# Patient Record
Sex: Female | Born: 1970 | Race: White | Hispanic: No | Marital: Single | Smoking: Never smoker
Health system: Southern US, Community
[De-identification: ages and names within clinical notes are randomized; demographics above are authoritative.]

## PROBLEM LIST (undated history)

## (undated) DIAGNOSIS — N809 Endometriosis, unspecified: Secondary | ICD-10-CM

## (undated) DIAGNOSIS — G43909 Migraine, unspecified, not intractable, without status migrainosus: Secondary | ICD-10-CM

## (undated) DIAGNOSIS — R251 Tremor, unspecified: Secondary | ICD-10-CM

## (undated) HISTORY — DX: Tremor, unspecified: R25.1

## (undated) HISTORY — DX: Migraine, unspecified, not intractable, without status migrainosus: G43.909

## (undated) HISTORY — DX: Endometriosis, unspecified: N80.9

## (undated) HISTORY — PX: ABDOMINAL HYSTERECTOMY: SHX81

## (undated) HISTORY — PX: APPENDECTOMY: SHX54

## (undated) HISTORY — PX: CHOLECYSTECTOMY: SHX55

---

## 2000-11-27 ENCOUNTER — Other Ambulatory Visit: Admission: RE | Admit: 2000-11-27 | Discharge: 2000-11-27 | Payer: Self-pay | Admitting: Obstetrics and Gynecology

## 2001-11-09 ENCOUNTER — Inpatient Hospital Stay (HOSPITAL_COMMUNITY): Admission: RE | Admit: 2001-11-09 | Discharge: 2001-11-12 | Payer: Self-pay | Admitting: Obstetrics and Gynecology

## 2002-04-22 ENCOUNTER — Other Ambulatory Visit: Admission: RE | Admit: 2002-04-22 | Discharge: 2002-04-22 | Payer: Self-pay | Admitting: Obstetrics and Gynecology

## 2003-05-05 ENCOUNTER — Other Ambulatory Visit: Admission: RE | Admit: 2003-05-05 | Discharge: 2003-05-05 | Payer: Self-pay | Admitting: Obstetrics and Gynecology

## 2003-12-25 ENCOUNTER — Ambulatory Visit (HOSPITAL_COMMUNITY): Admission: RE | Admit: 2003-12-25 | Discharge: 2003-12-25 | Payer: Self-pay | Admitting: Obstetrics and Gynecology

## 2004-06-03 ENCOUNTER — Encounter (INDEPENDENT_AMBULATORY_CARE_PROVIDER_SITE_OTHER): Payer: Self-pay | Admitting: Specialist

## 2004-06-03 ENCOUNTER — Inpatient Hospital Stay (HOSPITAL_COMMUNITY): Admission: RE | Admit: 2004-06-03 | Discharge: 2004-06-07 | Payer: Self-pay | Admitting: Obstetrics and Gynecology

## 2004-10-04 ENCOUNTER — Other Ambulatory Visit: Admission: RE | Admit: 2004-10-04 | Discharge: 2004-10-04 | Payer: Self-pay | Admitting: Obstetrics and Gynecology

## 2005-03-31 ENCOUNTER — Ambulatory Visit (HOSPITAL_COMMUNITY): Admission: RE | Admit: 2005-03-31 | Discharge: 2005-03-31 | Payer: Self-pay | Admitting: Obstetrics and Gynecology

## 2005-04-05 ENCOUNTER — Ambulatory Visit (HOSPITAL_COMMUNITY): Admission: RE | Admit: 2005-04-05 | Discharge: 2005-04-05 | Payer: Self-pay | Admitting: Obstetrics and Gynecology

## 2005-10-19 ENCOUNTER — Other Ambulatory Visit: Admission: RE | Admit: 2005-10-19 | Discharge: 2005-10-19 | Payer: Self-pay | Admitting: Obstetrics and Gynecology

## 2006-04-16 ENCOUNTER — Observation Stay (HOSPITAL_COMMUNITY): Admission: EM | Admit: 2006-04-16 | Discharge: 2006-04-18 | Payer: Self-pay | Admitting: *Deleted

## 2006-06-05 ENCOUNTER — Ambulatory Visit (HOSPITAL_COMMUNITY): Admission: RE | Admit: 2006-06-05 | Discharge: 2006-06-05 | Payer: Self-pay | Admitting: Internal Medicine

## 2006-07-02 ENCOUNTER — Ambulatory Visit (HOSPITAL_COMMUNITY): Admission: RE | Admit: 2006-07-02 | Discharge: 2006-07-02 | Payer: Self-pay | Admitting: Gastroenterology

## 2006-07-23 ENCOUNTER — Ambulatory Visit: Payer: Self-pay | Admitting: Cardiology

## 2006-08-02 ENCOUNTER — Ambulatory Visit: Payer: Self-pay | Admitting: Cardiology

## 2007-03-15 ENCOUNTER — Ambulatory Visit (HOSPITAL_BASED_OUTPATIENT_CLINIC_OR_DEPARTMENT_OTHER): Admission: RE | Admit: 2007-03-15 | Discharge: 2007-03-15 | Payer: Self-pay | Admitting: Urology

## 2007-10-04 ENCOUNTER — Encounter: Admission: RE | Admit: 2007-10-04 | Discharge: 2007-10-04 | Payer: Self-pay | Admitting: Obstetrics and Gynecology

## 2008-03-31 ENCOUNTER — Encounter: Admission: RE | Admit: 2008-03-31 | Discharge: 2008-03-31 | Payer: Self-pay | Admitting: Obstetrics and Gynecology

## 2008-08-07 ENCOUNTER — Ambulatory Visit (HOSPITAL_COMMUNITY): Admission: RE | Admit: 2008-08-07 | Discharge: 2008-08-07 | Payer: Self-pay | Admitting: Family Medicine

## 2008-08-31 ENCOUNTER — Encounter (INDEPENDENT_AMBULATORY_CARE_PROVIDER_SITE_OTHER): Payer: Self-pay | Admitting: Surgery

## 2008-08-31 ENCOUNTER — Ambulatory Visit (HOSPITAL_COMMUNITY): Admission: RE | Admit: 2008-08-31 | Discharge: 2008-08-31 | Payer: Self-pay | Admitting: Surgery

## 2009-02-05 ENCOUNTER — Encounter: Admission: RE | Admit: 2009-02-05 | Discharge: 2009-02-05 | Payer: Self-pay | Admitting: Obstetrics and Gynecology

## 2010-03-01 ENCOUNTER — Encounter: Admission: RE | Admit: 2010-03-01 | Discharge: 2010-03-01 | Payer: Self-pay | Admitting: Obstetrics and Gynecology

## 2010-10-06 LAB — CBC
HCT: 39.7 % (ref 36.0–46.0)
Hemoglobin: 13.6 g/dL (ref 12.0–15.0)
MCHC: 34.2 g/dL (ref 30.0–36.0)
MCV: 92.8 fL (ref 78.0–100.0)
Platelets: 281 10*3/uL (ref 150–400)
RBC: 4.28 MIL/uL (ref 3.87–5.11)
RDW: 12.4 % (ref 11.5–15.5)
WBC: 4.4 10*3/uL (ref 4.0–10.5)

## 2010-10-06 LAB — DIFFERENTIAL
Basophils Absolute: 0 10*3/uL (ref 0.0–0.1)
Basophils Relative: 1 % (ref 0–1)
Eosinophils Absolute: 0.1 10*3/uL (ref 0.0–0.7)
Eosinophils Relative: 3 % (ref 0–5)
Lymphocytes Relative: 30 % (ref 12–46)
Lymphs Abs: 1.3 10*3/uL (ref 0.7–4.0)
Monocytes Absolute: 0.3 10*3/uL (ref 0.1–1.0)
Monocytes Relative: 7 % (ref 3–12)
Neutro Abs: 2.6 10*3/uL (ref 1.7–7.7)
Neutrophils Relative %: 59 % (ref 43–77)

## 2010-10-06 LAB — COMPREHENSIVE METABOLIC PANEL
ALT: 10 U/L (ref 0–35)
AST: 13 U/L (ref 0–37)
Albumin: 4.6 g/dL (ref 3.5–5.2)
Alkaline Phosphatase: 55 U/L (ref 39–117)
BUN: 5 mg/dL — ABNORMAL LOW (ref 6–23)
CO2: 25 mEq/L (ref 19–32)
Calcium: 8.9 mg/dL (ref 8.4–10.5)
Chloride: 106 mEq/L (ref 96–112)
Creatinine, Ser: 0.68 mg/dL (ref 0.4–1.2)
GFR calc Af Amer: >60 mL/min (ref >60)
GFR calc non Af Amer: >60 mL/min (ref >60)
Glucose, Bld: 89 mg/dL (ref 70–99)
Potassium: 3.9 mEq/L (ref 3.5–5.1)
Sodium: 136 mEq/L (ref 135–145)
Total Bilirubin: 0.9 mg/dL (ref 0.3–1.2)
Total Protein: 6.9 g/dL (ref 6.0–8.3)

## 2010-11-08 NOTE — Op Note (Signed)
NAME:  Kelly Fields, Kelly Fields NO.:  [id number]   MEDICAL RECORD NO.:  [id number]          PATIENT TYPE:  AMB   LOCATION:  N[facility]                         FACILITY:  [facility]   PHYSICIAN:  Duane Boston, MD       DATE OF BIRTH:  1971/03/09   DATE OF PROCEDURE:  03/15/2007  DATE OF DISCHARGE:                               OPERATIVE REPORT   PREOPERATIVE DIAGNOSIS:  Right ureteral stone.   POSTOPERATIVE DIAGNOSIS:  Right ureteral stone.   PROCEDURE PERFORMED:  Cystoscopy, right retrograde pyelography, right  ureteroscopy, right ureteral stent placement.   SURGEON:  Jethro Bolus, MD.   ASSISTANTTerrial Rhodes. Richard, MD.   ANESTHESIA:  General endotracheal.   INDICATIONS FOR PROCEDURE:  Ms. Magnus Ivan is a 40 year old female with a  history of a ureteral stone.  This has failed conservative passage.  She  desires ureteroscopy.   DESCRIPTION OF PROCEDURE IN DETAIL:  The patient was brought to the  operating room.  She was identified by her armband. Informed consent was  verified, and a preoperative timeout was performed.  After the  successful induction of general anesthesia, the patient was moved to the  dorsal lithotomy position where all appropriate pressure points were  padded to avoid neurapraxic compartment syndrome.  The perineum was  prepped and draped, the surgeon was gowned and gloved.  Perioperative  antibiotics were administered.  A #22-French cystoscopic sheath was used  to introduce the 12-degree cystoscopic lens and the transurethral  balloon into the bladder.  Pan-cystourethroscopy was performed.  Bilateral ureteral orifices were noted to be in normal anatomic  position.  The right orifice was cannulated with a #5-French __________  catheter.  Contrast was injected, and a right retrograde pyelogram was  performed.   Right retrograde pyelogram revealed a small-caliber ureter with no  discrete narrowing.  There was an opacification in the proximal ureter  consistent with a stone.  There was no hydronephrosis.  A Glidewire was  advanced into the renal pelvis under fluoroscopic control and then all  the catheters were removed.  A 6.5 long semi-rigid ureteroscope was  driven into the ureter under direct vision.  Even just cannulating the  orifice was technically difficult.  The ureteroscope was gradually  advanced to the mid ureter with much difficulty secondary to the  narrowed caliber of the ureter.  This was likely simply a normal variant  in anatomy.  The ureteroscope did not reach the level of the stone.  A  second Glidewire was placed.  The patient was dropped into steep  Trendelenburg.  Attempts at passing a flexible scope over a wire were  unsuccessful.  Attempts at dilating the introducer of the access sheath  was unsuccessful.  At this time to avoid any damage or trauma to the  ureter, the decision was made to trace the stent and allow the ureter to  passively dilate.  A 6 x 24 double-J ureteral stent was advanced under  the renal pelvis under fluoroscopic control.  Once seen there, the wire  was removed.  Excellent proximal, distal curls were noted.  The bladder  was drained.  The procedure was terminated.  The patient tolerated the  procedure well, and there were no complications.  Sigmund Patsi Sears was  the attending primary responsible physician and was present and  participated in all aspects of the procedure.   DISPOSITION:  The patient was extubated uneventfully and transported  safely to the postanesthesia care unit.  She will be given a  prescription for Vicodin 5/500, #30.  She will likely need a second  procedure for definitive stone management, but her stent will stay in  place to passively dilate for at least one week.      Duane Boston, MD     BP/MEDQ  D:  03/15/2007  T:  03/15/2007  Job:  [id number]

## 2010-11-08 NOTE — Op Note (Signed)
NAME:  Kelly Fields, ASA NO.:  [id number]   MEDICAL RECORD NO.:  [id number]          PATIENT TYPE:  AMB   LOCATION:  DAY                          FACILITY:  [facility]   PHYSICIAN:  Sandria Bales. Ezzard Standing, M.D.  DATE OF BIRTH:  [birth year]   DATE OF PROCEDURE:  08/31/2008  DATE OF DISCHARGE:                               OPERATIVE REPORT   Date of surgery - 31 August 2008   PREOPERATIVE DIAGNOSIS:  Biliary dyskinesia.   POSTOPERATIVE DIAGNOSIS:  Biliary dyskinesia.   PROCEDURES:  Laparoscopic cholecystectomy with intraoperative  cholangiogram.   SURGEON:  Dr. Ovidio Kin.   FIRST ASSISTANT:  Velora Heckler, MD   ANESTHESIA:  General endotracheal.   ESTIMATED BLOOD LOSS:  Minimal.   INDICATIONS FOR PROCEDURE:  Kelly Fields is a 40 year old  white female, a patient of Dr. Mosie Epstein, who has had some vague  abdominal and epigastric pain.  She actually went through a cardiac  evaluation about 3 years ago. She had a ultrasound which showed no  gallstones; however, she did have a hepatobiliary scan dated August 07, 2008 that showed a 12% ejection fraction of the gallbladder.  This  is consistent with biliary dyskinesia.   I discussed with her the indications, potential complications of  gallbladder surgery.  Potential complications include, but are not  limited to, bleeding, infection, bile duct injury and open surgery.   I also spoke to her that with biliary dyskinesia the surgery seems to  help about 70% of patients but may not help as many as 30% of patients.   OPERATIVE NOTE:  The patient was placed in supine position, given a  general endotracheal anesthetic.  Her abdomen was prepped with Techni-  Care substitute and then sterilely draped.  A time-out was held  identifying the patient and the procedure.   The abdomen was entered through infraumbilical incision.  I used a 10 mm  laparoscope through a 12 mm Hasson trocar.  Hasson trocar secured with  a  0 Vicryl suture. I did abdominal exploration, revealed right and left  lobes of liver unremarkable.  The stomach is unremarkable bowel that I  could see was unremarkable.  I then turned the attention to the  gallbladder.  I placed a 10-mm subxiphoid trocar, a 5 mm right mid  subcostal, and a 5 mm lateral subcostal trocar.  The gallbladder was  rotated cephalad.  Dissection was carried out identifying the  gallbladder cystic duct junction and I did shoot an intraoperative  cholangiogram.   Intraoperative cholangiogram was shot using cutoff taut catheter  inserted through a 14 gauge Angiocath and then the taut catheter was  inserted in the side of the cut cystic duct.  This was secured with an  Endo clip.  I injected about 10 mL of half-strength Hypaque solution  under fluoroscopy.  This showed free flow down the cystic duct into the  common bile duct, up the hepatic radicals and down into the duodenum.  This showed it to be a normal intraoperative cholangiogram.   The taut catheter was then removed.  The cystic duct triply endoclipped  and divided.  The cystic artery was doubly endoclipped and divided and  there was also a posterior branch of cystic artery identified.   The gallbladder was sharply and bluntly dissected from the gallbladder  bed.  Prior to complete division of the gallbladder from the gallbladder  bed.  I revisualized the triangle of Calot and revisualized the  gallbladder bed.  This is no bleeding or bile leak from either.   The gallbladder was then placed in EndoCatch bag and delivered through  the umbilicus.  The abdomen was irrigated with about 500 mL of saline  and delivered through the umbilicus.   The umbilical port was closed with 0 Vicryl suture.  The skin at each  port was closed with 5-0 Vicryl suture, painted with Dermabond and  sterilely dressed.  The plan is to let the patient to home today if she  wakes up well.  She will be seen back in about 2 to 3  weeks for follow-  up.      Sandria Bales. Ezzard Standing, M.D.  Electronically Signed     DHN/MEDQ  D:  08/31/2008  T:  09/01/2008  Job:  [id number]   cc:   Dineen Kid. Rana Snare, M.D.  Fax: [phone]   Elvina Sidle, M.D.  Fax: [phone]   Evie Lacks, MD  Fax: [phone]

## 2010-11-11 NOTE — H&P (Signed)
NAME:  Kelly Fields, Kelly Fields NO.:  [id number]   MEDICAL RECORD NO.:  [id number]          PATIENT TYPE:  AMB   LOCATION:  [facility]C                           FACILITY:  [facility]   PHYSICIAN:  James A. Ashley Royalty, M.D.DATE OF BIRTH:  1971-03-26   DATE OF ADMISSION:  04/05/2005  DATE OF DISCHARGE:                                HISTORY & PHYSICAL   HISTORY OF PRESENT ILLNESS:  This is a 40 year old nulligravida with a long  history of endometriosis who presented March 29, 2005, complaining of lower  abdominal discomfort - debilitating.  She is status post TAH/LSO, lysis of  adhesions, and appendectomy December 2005.  Interestingly, at the time of  that procedure, there was no right ovary demonstrable to me, my surgical  assistant, or to the general surgeon, Dr. Orson Slick.  The patient presented  March 29, 2005, complaining of lower abdominal discomfort, right greater  than left.  Onset was March 24, 2005.  She described it as intermittent  in nature.  There was no concomitant nausea, vomiting, fever, or other GI  symptoms.  An FSH was obtained and the value was 5.5.  A CT scan was  obtained as well.  The CT scan revealed a thick-walled fluid collection in  the area of the right adnexa measuring 4 x 3.4 cm.  It was felt to be a  right ovarian cyst if the right ovary remains.  The patient was initially  managed with Lorcet for pain pending the outcome of the CT scan.  She states  the pain has improved slightly since the CT scan but she still has  difficulty getting around and doing her daily activities despite the use of  the narcotic analgesia.  In short, she requests surgical intervention.   MEDICATIONS:  None.   PAST MEDICAL HISTORY:  Medical:  Negative.  Surgical:  As above.   ALLERGIES:  None.   FAMILY HISTORY:  Positive for hypertension and diabetes.   SOCIAL HISTORY:  The patient denies the use of tobacco or significant  alcohol.   REVIEW OF SYSTEMS:   Noncontributory.   PHYSICAL EXAMINATION:  GENERAL:  A well-developed, well-nourished white  female in no acute distress.  VITAL SIGNS:  Afebrile, vital signs stable.  CHEST:  Lungs were clear.  CARDIAC:  Regular rate and rhythm.  ABDOMEN:  Soft, nontender, without masses or organomegaly.  There is a well-  healed surgical scar.  PELVIC:  External genitalia within normal limits.  Vagina without lesions.  Vaginal cuff is well-healed.  Bimanual examination reveals surgical absence  of the uterus.  I can appreciate no obvious adnexal masses.   IMPRESSION:  1.  History of endometriosis.  2.  Status post total abdominal hysterectomy, left salpingo-oophorectomy,      adhesiolysis, and appendectomy in December 2005.  No demonstrable right      adnexa was present at the time of that procedure.  3.  Recent CT scan revealing right ovarian cyst of 4 cm in greatest      diameter.  4.  Lower abdominal/pelvic discomfort, right greater than left.      Differential includes adnexal  cyst, musculoskeletal, adhesions, primary      GI, etc.  Debilitating.   PLAN:  Diagnostic/operative laparoscopy with possible right salpingo-  oophorectomy and indicated procedures.  Risks, benefits, complications, and  alternatives were fully discussed with the patient.  Possibility of  exploratory laparotomy discussed and accepted.  Questions invited and  answered.           ______________________________  Rudy Jew Ashley Royalty, M.D.     JAM/MEDQ  D:  04/05/2005  T:  04/05/2005  Job:  [id number]

## 2010-11-11 NOTE — Cardiovascular Report (Signed)
NAME:  Kelly Fields, Kelly Fields NO.:  [id number]   MEDICAL RECORD NO.:  [id number]          PATIENT TYPE:  INP   LOCATION:  3731                         FACILITY:  [facility]   PHYSICIAN:  Darlin Priestly, MD  DATE OF BIRTH:  February 16, 1971   DATE OF PROCEDURE:  DATE OF DISCHARGE:                              CARDIAC CATHETERIZATION   PROCEDURES:  1. Right heart catheterization.  2. Coronary angiography.  3. Left ventriculogram.   ATTENDING SURGEON:  Dr. Lenise Herald   COMPLICATIONS:  None.   INDICATION:  [name] is a 40 year old female patient of Dr. Yates Decamp  and [facility] with several months of intermittent chest pain.  She  was admitted on April 17, 2006 and subsequently ruled out for MI with  negative CK-MB and troponins.  She did have a cardiac CT which was  reportedly negative for pulmonary embolus.  Because of her intermittent  symptoms, she is now referred for cardiac catheterization to rule out  significant CAD.   DESCRIPTION OF OPERATION:  After obtaining informed written consent, the  patient was brought to the cardiac cath lab where the right groin was  shaved, prepped and draped in the usual sterile fashion.  Anesthesia monitor  established.  With the modified Seldinger technique, a #6-French  intraarterial sheath was inserted in the right femoral artery.  A 6-French  diagnostic catheter was used to perform diagnostic angiography.   The left main is a large vessel with no evidence of disease.   The LAD is a medium-to-large-sized vessel which courses to give rise to 2  diagonal branches.  The LAD has no evidence of disease.   The first diagonal is a small-to-medium-sized vessel with no evidence of  disease,.   The second diagonal is a small vessel with no evidence of disease.   The left circumflex is a medium-sized vessel which courses to give rise to 1  obtuse marginal branch.  There is a kinking segment in the proximal portion  of  the left circumflex, though this does not appear to be obstructing flow.  There is a TIMI-3 flow throughout the AV groove circumflex as well as the  first OM.   The first OM is a medium-sized vessel which bifurcates into the mid-segment  with no evidence of disease.   The right coronary artery is a large vessel which is dominant and gives rise  to the PDA, as well as posterolateral branch.  There is also noted to be a  notable branch arising from the PLA.  The RCA, PDA and posterolateral branch  have no significant disease.   The left ventriculogram reveals a preserved EF at 60%.   Hemodynamics:  Systemic arterial pressure 100/65, LV systemic pressure 90/4,  LVEDP of 10.   CONCLUSION:  1. Noncritical CAD.  2. Normal LV systolic function.      Darlin Priestly, MD  Electronically Signed     RHM/MEDQ  D:  04/17/2006  T:  04/17/2006  Job:  [id number]   cc:   Cristy Hilts. Jacinto Halim, MD  [facility]

## 2010-11-11 NOTE — H&P (Signed)
NAME:  Kelly Fields, Kelly Fields NO.:  [id number]   MEDICAL RECORD NO.:  [id number]                  PATIENT TYPE:   LOCATION:                                       FACILITY:   PHYSICIAN:  Rudy Jew. Ashley Royalty, M.D.             DATE OF BIRTH:  1971-05-18   DATE OF ADMISSION:  12/25/2003  DATE OF DISCHARGE:                                HISTORY & PHYSICAL   HISTORY OF PRESENT ILLNESS:  This is a 40 year old nulligravida with a known  history of endometriosis who presented to me recently complaining of  increasing right lower quadrant discomfort which is debilitating.  She seeks  surgical intervention for same.  She is status post discharge laparoscopy  with exploratory laparotomy, right salpingectomy, lysis of adhesions on Nov 08, 2001.  Multiple biopsies were taken as well at that time which returned  showing endometriosis.  Multiple lesions were fulgurated at that time.  The  patient has been maintained intermittently on Lupron since then with  reasonable control until the recent flare up.  The patient specifically  states to me today she has no desire for childbearing ever and ultimately  may want to have a hysterectomy with bilateral adnexectomy.  Ultrasound was  obtained December 10, 2003 which revealed basically no obvious abnormalities  save for a 4 mm uterine fibroid.   PAST MEDICAL HISTORY:  Medical:  Negative.  Surgical:  As above.   ALLERGIES:  None.   FAMILY HISTORY:  Positive for hypertension and diabetes.   SOCIAL HISTORY:  The patient denies the use of tobacco or significant  alcohol.   REVIEW OF SYSTEMS:  Noncontributory.   PHYSICAL EXAMINATION:  GENERAL:  Well-developed, well-nourished, pleasant  white female in no acute distress.  VITAL SIGNS:  Afebrile, vital signs stable.  SKIN:  Warm and dry without lesions.  LYMPH:  There is no supraclavicular, cervical, or inguinal adenopathy.  HEENT:  Normocephalic.  NECK:  Supple without  thyromegaly.  CHEST:  Lungs are clear.  CARDIAC:  Regular rate and rhythm without murmurs, gallops, or rubs.  BREAST:  Deferred.  ABDOMEN:  Soft, nontender, without masses or organomegaly.  Bowel sounds are  active.  MUSCULOSKELETAL:  Reveals full range of motion without edema, cyanosis, or  CVA tenderness.  PELVIC:  (June 1) External genitalia within normal limits.  Vagina and  cervix were without gross lesions.  Bimanual examination reveals the uterus  to be approximately 8 x 4 x 4 cm and no adnexal masses are palpable.   IMPRESSION:  1. Endometriosis.  2. Right lower quadrant discomfort - differential includes endometriosis,     adnexal pathology, primary gastrointestinal, etcetera.   PLAN:  Diagnostic/operative laparoscopy.  Risks, benefits, complications,  and alternatives fully discussed with the patient.  The possibility of  unilateral salpingo-oophorectomy discussed and accepted.  The possibility of  exploratory laparotomy discussed and accepted.  Questions invited and  answered.  James A. Ashley Royalty, M.D.    JAM/MEDQ  D:  12/25/2003  T:  12/25/2003  Job:  [id number]   cc:   Outpatient surgery at [facility]

## 2010-11-11 NOTE — Op Note (Signed)
NAME:  Kelly Fields, Kelly Fields NO.:  [id number]   MEDICAL RECORD NO.:  [id number]          PATIENT TYPE:  INP   LOCATION:  9318                          FACILITY:  [facility]   PHYSICIAN:  Lorre Munroe., M.D.DATE OF BIRTH:  1970-11-27   DATE OF PROCEDURE:  06/03/2004  DATE OF DISCHARGE:                                 OPERATIVE REPORT   PREOPERATIVE DIAGNOSIS:  Difficult hysterectomy.   POSTOPERATIVE DIAGNOSIS:  Difficult hysterectomy.   REASON FOR CONSULTATION:  Dr. Ashley Royalty called me because there was chronic  inflammation and difficulty in visualization of the ovaries, considerable  involvement of the intestines and pelvic adhesions while he was performing  hysterectomy and bilateral salpingo-oophorectomy.  He asked me to come in to  assist with definition of the anatomy and handling of bowel.  The patient is  a 40 year old white female known to have endometriosis.  She is not known to  have any chronic gastrointestinal symptoms.  She did have some chronic pain.   PROCEDURE IN DETAIL:  I came and scrubbed in and Dr. Ashley Royalty demonstrated  the anatomy to me.  He had been able to free up the uterus from the bowel  and he and Dr. Clearance Coots continued with the completion of total abdominal  hysterectomy.  I then briefly inspected the area and I could easily see the  anterior surface of the rectum and the sigmoid going down to it.  The  sigmoid had been dissected free from the small intestines which were packed  upward and noted some evidence of dissection on the antimesenteric border of  the sigmoid colon.  We had good definition of the left tube but the ovary  was not apparent.  I dissected the scar tissue in that area, dissecting the  tube off the pelvic sidewall and then dissected posteriorly taking great  care to avoid injury to the ureter.  The ureter was not immediately apparent  due to the fact that there was so much chronic scar tissue present but I  felt sure that we  did not have injure it.  We all agreed that the ovary was  dissected up and then Dr. Ashley Royalty completed performance of the salpingo-  oophorectomy.  We then dissected carefully on the left side removing scar  tissue from the anterolateral surface of the rectum and searched for an  ovary but there was a great deal of scar tissue present and after a fairly  extensive search, we did not feel that we could definitely see an ovary.  I  felt that further dissection in the retroperitoneum was likely to be  hazardous and might result in vascular or ureteral injury and Dr. Ashley Royalty  agreed with me and we abandoned that search.  I then inspected the rectum  and found that it had no evidence of injury.  I inspected the sigmoid colon  and found that there was a seromuscular injury in the antimesenteric side.  There was no full thickness injury. I repaired that with several figure-of-  eight suture of 3-0 Vicryl and I felt that integrity was good.  I then  inspected the entire  small bowel and found no evidence of any injuries or  other abnormalities.  I inspected the right colon, the transverse colon, and  felt that they appeared normal.  Because of the patient's chronic pain,  diagnosis of endometriosis, and likelihood of formation of extensive pelvic  adhesions, we felt that appendectomy was indicated.  I grasped the appendix  with a Babcock clamp and divided the mesentery in two bites between Sylvia  clamps and ligated the vessels with a heavy suture.  I then placed 3-0 silk  purse string suture in the seromuscular layers of the cecum around the  appendix and ligated the appendix with #1 chromic and amputated it and  cauterized the mucosa which was exposed.  I then tied the purse string  suture as the appendix was inverted into the cecum and it resulted in a very  nice appearance of the cecum at the completion of the appendectomy.  Hemostasis was not a problem.  I then inspected the other areas which I  had  dissected or assisted in dissection and I felt that hemostasis was good and  there was no evidence of any injury.  I then scrubbed out and Dr. Ashley Royalty  and Dr. Clearance Coots completed the procedure.     Will   WB/MEDQ  D:  06/03/2004  T:  06/04/2004  Job:  [id number]   cc:   Rudy Jew. Ashley Royalty, M.D.  [address]  [place], [place] [address]  Fax: [phone]

## 2010-11-11 NOTE — Discharge Summary (Signed)
NAME:  Kelly Fields, Kelly Fields NO.:  [id number]   MEDICAL RECORD NO.:  [id number]          PATIENT TYPE:  INP   LOCATION:  3731                         FACILITY:  [facility]   PHYSICIAN:  Darlin Priestly, MD  DATE OF BIRTH:  03-Jun-1971   DATE OF ADMISSION:  04/16/2006  DATE OF DISCHARGE:  04/18/2006                                 DISCHARGE SUMMARY   Ms. Kelly Fields is a 40 year old female patient of Dr. Jeanella Cara who came into  the hospital with chest pain.  She was seen by Dr. Effie Shy who was on-call  for our service.  She apparently has had a Persantine Cardiolite test that  revealed dynamic EKG changes, but her Myoview images were normal.  It was  planned that she was to have a heart catheterization soon; however, she came  into the hospital with chest pain and she underwent cardiac catheterization  the following day by Dr. Lenise Herald.  She was found to have normal  coronary arteries.  She did have a kink in her circumflex.  She had TIMI III  flow.  It was felt she could be discharged later that day; however, she had  more chest pain and it was decided that she should undergo a gallbladder  ultrasound.  She did also have a CT scan, which was negative for pulmonary  embolus.  We also called a GI consult.  She was seen by Dr. Elnoria Howard.  He  increased her Protonix to 40 mg b.i.d.  Her abdominal ultrasound was  negative for any gallstones.  Thus, it was decided she could be discharged  home.  Her amylase was 68, lipase was 27.  She was seen by Dr. Jenne Campus on  April 18, 2006.  Her blood pressure was 92/52, heart rate was 63,  respirations were 18.  Sodium was 140, potassium was 4.0, BUN was 8,  creatinine was 0.6 and her glucose was 83.   DISCHARGE MEDICATIONS:  Are:  1. Aspirin 81 mg one time per day.  2. Topamax 75 mg one time per day.  3. Norvasc 2.5 mg one time per day.  4. Protonix 40 mg two times per day.   She should do no strenuous activity, lifting, pushing or  pulling for one  week.  She will follow up with Dr. Jacinto Halim on November 7 at 4 p.m.   OTHER LABS:  Her hemoglobin was 12.4, hematocrit was 36.1, WBCs were 6.7,  platelets were 356.  CK-MBs and troponins are negative x3.  Total  cholesterol is 142, triglycerides were 39, HDL was 41, LDL was 93.   DISCHARGE DIAGNOSES:  1. Chest pain.  She has had some dynamic EKG changes that then normalized.      Questionable if she had coronary spasm.  Chest pain is not due to      atherosclerosis.  She is now status post cardiac catheterization with      normal coronary arteries.  She has been placed on Norvasc 2.5 mg a day.  2. History of migraine headaches.  3. Endometriosis.  4. Gastrointestinal.  It is possible she may have some reflux disease.  She was seen by Dr. Elnoria Howard.  He increased her Protonix to 40 mg b.i.d.      Her amylase and lipase were all negative and her gallbladder ultrasound      was negative.  If her chest pain continues, she may need to undergo an      EGD.      Lezlie Octave, N.P.      Darlin Priestly, MD  Electronically Signed    BB/MEDQ  D:  04/18/2006  T:  04/19/2006  Job:  [id number]   cc:   Jordan Hawks. Elnoria Howard, MD

## 2010-11-11 NOTE — Op Note (Signed)
NAME:  Kelly Fields, Kelly Fields NO.:  [id number]   MEDICAL RECORD NO.:  [id number]         PATIENT TYPE:  WAMB   LOCATION:                                FACILITY[facility]  [facility]   PHYSICIAN:  James A. Ashley Royalty, M.D.DATE OF BIRTH:  23-Jan-1971   DATE OF PROCEDURE:  04/05/2005  DATE OF DISCHARGE:  04/05/2005                                 OPERATIVE REPORT   PREOPERATIVE DIAGNOSIS:  1.  History endometriosis.  2.  Status post total abdominal hysterectomy, left salpingo-oophorectomy and      appendectomy.  3.  Right-sided pelvic mass on CT scan.  4.  For abdominal/pelvic discomfort, right greater than left - debilitating.   POSTOPERATIVE DIAGNOSIS:  1.  Pelvic adhesions.  2.  No evidence of right tube or right ovary.   PROCEDURE:  1.  Diagnosis/operative laparoscopy (open technique).  2.  Lysis of adhesions.   SURGEON:  Rudy Jew. Ashley Royalty, M.D.   ANESTHESIA:  General.   ESTIMATED BLOOD LOSS:  50 mL.   COMPLICATIONS:  None.   PACKS AND DRAINS:  None.   PROCEDURE:  The patient is taken to the operating room, placed in the dorsal  supine position. After general anesthesia was administered. She was placed  in the lithotomy position and prepped and draped in usual manner for  abdominal and vaginal surgery. Longitudinal 1.5 cm incision was made in the  inferior aspect of the umbilicus. The subcutaneous tissues were sharply  dissected down to the fascia which was incised longitudinally. The  peritoneum was elevated with hemostats and entered atraumatically. Stay  sutures of 0 Vicryl were placed in the fascia and the Hasson open  laparoscopic trocar was placed in the abdominal cavity. Location was  verified by placement laparoscope. The trocar was stabilized with the stay  sutures. Pneumoperitoneum was created with CO2 and maintained throughout the  procedure. Next, two 5 mm suprapubic trocars were placed in the left and  right lower quadrants respectively using transillumination  and direct  visualization techniques. At this point the pelvis was thoroughly surveyed.  There was an adhesion from the omentum to the anterior abdominal wall below  the umbilicus. In addition, the sigmoid colon was adherent to the left  pelvic sidewall and the posterior cul-de-sac by filmy adhesions. Finally, it  appeared that the sigmoid colon was also adherent somewhat to the anterior  cul-de-sac peritoneum. The uterus and left adnexa were of course surgically  absent. As was the case at the time of the patient's hysterectomy, the right  adnexa could not be identified and the operator inferred that any reference  to same in a recent CT scan was in fact spurious. Appropriate photographs  were obtained.   Attention was then turned to the lysis of adhesions. The bipolar and  unipolar cautery systems were employed to lyse the adhesion to the anterior  abdominal wall as well as the filmy adhesions of the sigmoid colon to the  posterior cul-de-sac and the left pelvic sidewall. Interestingly, the right  aspect of the patient's pelvis appeared relatively clean and required little  in the way of enterolysis. The only area that  was felt to be too risky for  lysis was the area where the apparent sigmoid colon was adherent to the  anterior cul-de-sac just to the right of midline. No good plane could be  established and it was felt in the patient's best interest to avoid  additional attempts to lyse this particular adhesion. She had quite a number  of adhesions lysed and was felt that should she gain relief, she will have  avoided larger operation just to get that one adhesion lysed. In addition,  should she continue to have symptoms postoperatively the adhesion can be  addressed on different day via laparoscopy or laparotomy with the aid of a  general surgeon. At any point it was felt to be in the patient's best  interest to abandon any further efforts at enterolysis on this particular  operative  session.   The patient was felt to have benefited maximally from the surgical  procedure. The abdominal instruments removed and pneumoperitoneum evacuated.  Fascial defects were closed 0 Vicryl in an interrupted fashion. The skin was  closed with Monocryl. Skin on the inferior incisions was also closed with  Dermabond. It should be mentioned a Foley catheter was placed prior to the  laparoscopy and maintained throughout the procedure.   At the conclusion of the procedure the urine was clear and copious. The  patient was taken to the recovery room in good condition.           ______________________________  Rudy Jew Ashley Royalty, M.D.     JAM/MEDQ  D:  04/05/2005  T:  04/05/2005  Job:  [id number]

## 2010-11-11 NOTE — Op Note (Signed)
NAME:  AMEENA, VESEY NO.:  [id number]   MEDICAL RECORD NO.:  [id number]          PATIENT TYPE:  INP   LOCATION:  9318                          FACILITY:  [facility]   PHYSICIAN:  James A. Ashley Royalty, M.D.DATE OF BIRTH:  14-May-1971   DATE OF PROCEDURE:  06/03/2004  DATE OF DISCHARGE:                                 OPERATIVE REPORT   PREOPERATIVE DIAGNOSES:  1.  Endometriosis.  2.  Low back and pelvic pain, right greater than left, probably secondary to      endometriosis.  3.  Desire for definitive therapy for endometriosis.  4.  Status post right salpingectomy.   POSTOPERATIVE DIAGNOSIS:  1.  Endometriosis.   Dictation stopped here.      JAM/MEDQ  D:  06/03/2004  T:  06/04/2004  Job:  [id number]

## 2010-11-11 NOTE — Consult Note (Signed)
NAME:  Kelly Fields, Kelly Fields NO.:  [id number]   MEDICAL RECORD NO.:  [id number]          PATIENT TYPE:  INP   LOCATION:  3731                         FACILITY:  [facility]   PHYSICIAN:  Jordan Hawks. Elnoria Howard, MD    DATE OF BIRTH:  03/09/1971   DATE OF CONSULTATION:  04/17/2006  DATE OF DISCHARGE:                                   CONSULTATION   REFERRING PHYSICIAN:  Vonna Kotyk R. Jacinto Halim, MD   </   REASON FOR CONSULTATION:  Chest pain.   HISTORY OF PRESENT ILLNESS:  This is a 40 year old female with a past  medical history of abdominal pain status post TAH for endometriosis who is  admitted to the hospital for further evaluation of her chest pain.  The  patient apparently started with chest pain that is constant since September  this year.  It was acute in onset and described as a substernal chest  pressure with undulations in her pain in that its intensity can increase.  The patient denies any modifying or alleviating factors.  There is no  history of gastroesophageal reflux disease or dysphagia.  The patient does  not have any nocturnal waking as a result of the pain. She was seen by Dr.  Jacinto Halim in the office and a Persantine-thallium was performed and did reveal  that there is some possible abnormality. She was subsequently scheduled for  a cardiac catheterization; however, her pain had worsened and became more  intense, and subsequently she presented to the hospital for further  evaluation and treatment.   The cardiac catheterization was undertaken, and there is no evidence of any  coronary abnormalities at this time.  Hence, a GI consultation was requested  for further evaluation and treatment.   PAST MEDICAL HISTORY AND PAST SURGICAL HISTORY:  As stated above.   FAMILY HISTORY:  Is noncontributory.   SOCIAL HISTORY:  The patient is single.  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   MEDICATIONS:  Topamax.   ALLERGIES:  No known drug allergies.   REVIEW OF  SYSTEMS:  Positive for chest pain. No shortness of breath. No  abdominal pain.  No diarrhea, constipation, dysuria, dysarthria, dysphagia,  blurry vision, arthritis, arthralgias or neurologic complaints.   PHYSICAL EXAMINATION:  VITAL SIGNS:  Vital signs are stable.  GENERAL:  The patient is in no acute distress, alert and oriented.  HEENT:  Normocephalic, atraumatic.  Extraocular muscles intact.  Pupils  equal, round, reactive to light.  NECK:  Supple.  No lymphadenopathy.  LUNGS: Clear to auscultation bilaterally.  CARDIOVASCULAR:  Regular rate and rhythm.  ABDOMEN: Flat, soft, nontender, nondistended.  No hepatosplenomegaly.  EXTREMITIES:  No clubbing, cyanosis or edema.   LABORATORY VALUES:  Pending at this time.   IMPRESSION:  1. Probable noncardiac chest pain.  2. History of abdominal pain.   After evaluation of the patient, I was not able to reproduce the patient's  chest pain even with palpation. There is a possibility that this is from a  gastroesophageal source. At this time, I believe it would be prudent to  start the patient on a high-dose PPI  in order to determine if this does  resolve her pain.  I wound tend to keep this patient on high-dose PPI for  approximately 1 month and then reevaluate her symptoms at that time. If her  symptoms are not improved, then EGD evaluation will be performed.  However,  it is improved, then no further workup will be undertaken.   PLAN:  1. Increase Protonix to 40 mg p.o. b.i.d.  2. Agree with the GI cocktail at this time.  3. Agree with a right upper quadrant ultrasound and checking amylase and      lipase.      Jordan Hawks Elnoria Howard, MD  Electronically Signed     PDH/MEDQ  D:  04/17/2006  T:  04/18/2006  Job:  [id number]   cc:   Cristy Hilts. Jacinto Halim, MD  [facility]

## 2010-11-11 NOTE — Procedures (Signed)
[facility]                              EXERCISE TREADMILL   Kelly Fields, Kelly Fields                 MRN:          [id number]  DATE:08/02/2006                            DOB:          04-18-1971    TREADMILL TEST   [name] was able to exercise 12 minutes and achieved a heart rate  of 173, at which time the test was terminated.  There was no chest pain  and there were no arrhythmias during exercise.  The resting  electrocardiogram showed very minor nonspecific ST-T changes.  There  were no arrhythmias during exercise.  The post-exercise EKG showed no  significant ST segment changes.  This was interpreted as negative  exercise test and good exercise tolerance.     Bruce Elvera Lennox Juanda Chance, MD, [facility]  Electronically Signed    BRB/MedQ  DD: 08/02/2006  DT: 08/02/2006  Job #: [id number]   cc:   Jonita Albee, M.D.  Genene Churn. Love, M.D.

## 2010-11-11 NOTE — Op Note (Signed)
NAME:  Kelly Fields, BENE NO.:  [id number]   MEDICAL RECORD NO.:  [id number]          PATIENT TYPE:  INP   LOCATION:  9318                          FACILITY[facility]  [facility]   PHYSICIAN:  James A. Ashley Royalty, M.D.DATE OF BIRTH:  1971-01-22   DATE OF PROCEDURE:  06/03/2004  DATE OF DISCHARGE:                                 OPERATIVE REPORT   PREOPERATIVE DIAGNOSIS:  1.  Endometriosis.  2.  Status post right salpingectomy.  3.  Low back and pelvic pain - right greater than left, probably secondary      to endometriosis.  4.  Desire for definitive surgical therapy.   POSTOPERATIVE DIAGNOSIS:  1.  Endometriosis.  Pathology pending.  2.  Multiple and extensive pelvic adhesions.      1.  Omentum to anterior parietal peritoneum.      2.  Sigmoid colon to anterior parietal peritoneum and posterior aspect          of uterus.  3.  Absence of any remnants of the right adnexa.   PROCEDURE:  Total abdominal hysterectomy, left salpingo-oophorectomy.  Lysis  of adhesions.  Appendectomy (per Lebron Conners, M.D.).   SURGEON:  Rudy Jew. Ashley Royalty, M.D. (for TAH, LSO, and lysis).   ASSISTANT:  Charles A. Clearance Coots, M.D. (for TAH, LSO, and lysis of adhesions).   Please see separate dictation per Dr. Orson Slick regarding appendectomy and  reinforcement of serosa of sigmoid colon.   ANESTHESIA:  General.   ESTIMATED BLOOD LOSS:  900 mL.   COMPLICATIONS:  None.   DRAINS:  Foley.   COUNT:  Needle, sponge, and instrument counts correct x2.   DESCRIPTION OF PROCEDURE:  The patient was taken to the operating room and  placed in the dorsal supine position.  After general anesthesia was  administered, she was placed in the frog-leg position and prepped and draped  in the usual sterile fashion for abdominal surgery.  The vagina was prepped  as well.  Foley catheter was placed.  The patient was then returned to the  supine position and the draping was completed.  A Pfannenstiel incision was  made  through the patient's old Pfannenstiel skin incision.  The subcutaneous  tissues were sharply and bluntly dissected down to the fascia which was  nicked with a knife and incised transversely with Mayo scissors.  The  underlying rectus muscles were separated from the overlying fascia using  sharp and blunt dissection.  The rectus muscles were separated in the  midline exposing the peritoneum which was entered atraumatically with  Metzenbaum scissors.  Immediately upon entering the abdominal cavity,  numerous adhesions were encountered.  Near the superior aspect of the  peritoneal incision, the omentum was densely adherent to the anterior  parietal peritoneum.  It was released using meticulous dissection with  pedicles tied with 2-0 Vicryl ties.  As the peritoneal incision was extended  inferiorly, a dense adhesion was encountered from the sigmoid colon to the  anterior parietal peritoneum.  The sigmoid was successfully dissected free  of the peritoneum without any substantial trauma.  There were a couple of  small serosal interruptions only that  would later be reinforced.  After the  omentum and sigmoid colon adhesions were removed, the pelvis was visualized.  The uterus was noted in the midline and dense adhesions were noted from the  sigmoid colon to the posterior aspect of the uterus as well.  The left  fallopian tube was visualized, but the left ovary could not be visualized.  The right adnexa could not be visualized at all, keeping in mind that the  patient had sustained a previous salpingectomy on the right side.  The  colonic adhesions were gradually lysed and the patient prepared to initiate  the hysterectomy.  Prior to the initiation of the hysterectomy,  approximately 50 minutes were required in order to lyse the existing  adhesions.   Attention was then turned to the hysterectomy proper.  The round ligaments  were clamped, cut, and secured with #1 chromic catgut.  The broad  ligament  was opened and the bladder flap created by incising the anterior uterine  serosa and sharply and bluntly dissecting the bladder inferiorly.  The broad  ligament was incised posteriorly as well.  Since neither adnexa could be  visualized at this point despite having the sigmoid colon dissected free,  the Heaney clamps were placed initially as though the patient were to have  ovarian preservation with the intention of later dissecting the adnexa out.  Hence, the Heaney clamps were bilaterally placed on the uterine-ovarian  anastomosis and on the left side on the fallopian tube.  The pedicles were  clamped, cut, and doubly secured with #1 chromic catgut.  The uterine  vessels were then skeletonized bilaterally.  The uterine vessels were  clamped, cut, and bilaterally secured with #1 chromic catgut.  Next, the  cardinal ligaments were clamped bilaterally.  The pedicles were clamped,  cut, and secured with #1 chromic catgut.  The uterosacral ligaments were  clamped, cut, and secured with #1 chromic catgut.  The vagina was entered  and the cervix excised with Satinsky scissors.  The uterus was then  submitted to pathology for histologic studies.  Richardson angle sutures  were placed bilaterally using #1 chromic catgut.  Next, interrupted sutures  of 2-0 Vicryl were used on the vaginal cuff in order to close the cuff  completely.   At this point, Lebron Conners, M.D. who had been previously summoned,  entered the room.  Attention was turned to assessing the GI tract.  The  patient's GI tract was examined thoroughly including the small and large  bowel.  There were a couple of areas on the large bowel serosa that required  reinforcement, however, there were no luminal injuries noted whatsoever.  Please see separate dictation per Dr. Orson Slick.  In addition, Dr. Orson Slick  performed an appendectomy, which is also dictated separately.  At this point, Dr. Orson Slick and Dr. Ashley Royalty were  successful in dissecting out  the left ovary and residual left fallopian tube.  The left adnexa was  submitted to pathology separately for histologic studies.  Attempt was made  to dissect out the right ovary, however, with meticulous dissection and  viewing all of the visceral peritoneal surfaces, no residual ovary was  located.  All three operators felt it was in the patient's best interest to  not search further for the right ovary, but rather to surmise that had lost  sufficient blood supply with the salpingectomy that the remainder of the  ovarian tissue in all probability underwent necrosis.   At this point, the patient was felt  to have benefitted maximally from the  surgical procedure.  All of the operative surfaces were inspected.  Small  bleeders were easily controlled with the cautery.  Copious irrigation was  accomplished.  Hemostasis was noted.  The Balfour retractor and all packs  were removed.  The parietoperitoneum was closed with 3-0 Vicryl in a running  fashion.  The fascia was closed with 0 Vicryl in a running fashion.  The  skin was closed with staples.   At the conclusion of the procedure, the urine was clear and copious.  The  patient tolerated the procedure extremely well and was returned to the  recovery room in good condition.      JAM/MEDQ  D:  06/03/2004  T:  06/04/2004  Job:  [id number]

## 2010-11-11 NOTE — Assessment & Plan Note (Signed)
[facility]                            CARDIOLOGY OFFICE NOTE   LUJAIN, KRASZEWSKI                 MRN:          [id number]  DATE:08/02/2006                            DOB:          1971-03-20    OFFICE NOTE   Ms. Faulkenberry returned for a treadmill test to evaluate her chest pain  for the possibility of microvascular angina.  She had an adenosine scan  at [facility], which showed some ST-T changes on her ECG, but no  abnormality on the scan.  She subsequently had a coronary angiogram,  which was normal.  This was done by Dr. Jenne Campus.  We performed a stress  test on her today to see if she might have microvascular angina, looking  for ST segment change with exercise.  She did quite well with the  exercise test and had no chest pain and no ST segment changes.   In view of all of the findings and workup, I do not think that Mrs.  Taul's chest pain is cardiac-related.  She has also been evaluated  for reflux with endoscopy, and this was also negative.  She has also had  a CT to evaluate her for other possible causes of chest pain, and this  was reportedly negative, although I do not have a full report.   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  I  think she can stop the Norvasc, which I think was begun for the  possibility of spasm or microvascular angina.  She is on Topamax for  migraine and I do not see any specific contraindications with this for  chest pain, but I talked with her and her mother, who is a missionary  and came back to visit with her daughter, and we thought it would be  reasonable to try her off Topamax for 1 to 2 weeks to see if this makes  a difference.  If it does, I suggested she contact Dr. Sandria Manly for a  substitute for Topamax.  If it does not, then  I suggest she give her  symptoms some more time and see if they improve with time.  If they do  not, then I suggested she see Dr. Perrin Maltese back for further evaluation for  noncardiac causes for her chest pain.     Bruce Elvera Lennox Juanda Chance, MD, [facility]  Electronically Signed    BRB/MedQ  DD: 08/02/2006  DT: 08/02/2006  Job #: [id number]   cc:   Genene Churn. Love, M.D.  Jonita Albee, M.D.

## 2010-11-11 NOTE — H&P (Signed)
NAME:  LIMA, CHILLEMI NO.:  [id number]   MEDICAL RECORD NO.:  [id number]           PATIENT TYPE:   LOCATION:                                 FACILITY:   PHYSICIAN:  Rudy Jew. Ashley Royalty, M.D.     DATE OF BIRTH:   DATE OF ADMISSION:  06/03/2004  DATE OF DISCHARGE:                                HISTORY & PHYSICAL   HISTORY OF PRESENT ILLNESS:  This is a 40 year old nulligravida who  originally presented to me June 2002 for GYN care.  She was subsequently  diagnosed with endometriosis and a probable right hydrosalpinx.  On Nov 08, 2001, she underwent diagnostic laparoscopy with exploratory laparotomy and  right salpingectomy with lysis of adhesions and multiple biopsies consistent  with endometriosis.  Endometriosis was in fact confirmed on histologic  examination.  Fulguration was accomplished as well.   The patient has since been treated with new medications modality including  oral contraceptives, including continuous fashion.  She has also been  treated with Lupron, all of which have failed to adequately medicate her  pelvic discomfort.  She now states the desire for definitive therapy in the  form of total abdominal hysterectomy with bilateral oophorectomy and left  salpingectomy.   MEDICATIONS:  1.  Oral contraceptives.  2.  Percocet 5/325 mg 1 q.4h. p.r.n.   PAST MEDICAL HISTORY:  Negative.   PAST SURGICAL HISTORY:  As above.   ALLERGIES:  None.   FAMILY HISTORY:  Positive for hypertension and diabetes.   SOCIAL HISTORY:  The patient denies the use of tobacco or significant  alcohol.   REVIEW OF SYMPTOMS:  Noncontributory.   PHYSICAL EXAMINATION:  GENERAL:  Well-developed, well-nourished, pleasant  white female in no acute distress.  Part of the physical obtained from most  recent office evaluation.  The patient was last seen in the office May 30, 2004.  VITAL SIGNS:  Afebrile.  Vital signs stable.  SKIN:  Warm and dry without lesions.  LYMPH NODES:  There is no supraclavicular, cervical, or inguinal adenopathy.  HEENT:  Normocephalic.  NECK:  Supple without thyromegaly.  CHEST:  Lungs are clear.  CARDIOVASCULAR:  Regular rate and rhythm without murmurs, gallops, or rubs.  BREASTS:  Deferred.  ABDOMEN:  Soft and nontender without masses or organomegaly.  Bowel sounds  are active.  MUSCULOSKELETAL:  Full range of motion without edema, cyanosis, or CVA  tenderness.  PELVIC:  External genitalia within normal limits.  Vagina and cervix were  without gross lesions.  Bimanual examination revealed the uterus to be  approximately 10 by 6 by 5 cm and no adnexal masses were palpable.   IMPRESSION:  1.  Endometriosis.  2.  Low back and pelvic pain, right greater than left, probably secondary to      endometriosis.  3.  Desire for definitive therapy:  Total abdominal hysterectomy, bilateral      oophorectomy, and left salpingectomy.   PLAN:  Total abdominal hysterectomy, left salpingectomy, and bilateral  oophorectomy.  Risks, benefits, and complications were discussed with the  patient.  The patient understands she will  no longer be able to bear  children and she will no longer have menstrual periods.  Furthermore, she  understands she will become a candidate for hormone replacement therapy and  agrees to same.  We will also give bowel prep in the event of inadvertent GI  surgery.  Questions invited and answered.      JAM/MEDQ  D:  06/02/2004  T:  06/02/2004  Job:  [id number]   cc:   Outpatient Surgical

## 2010-11-11 NOTE — Op Note (Signed)
NAME:  Kelly Fields, Kelly Fields                    ACCOUNT NO.:  [id number]   MEDICAL RECORD NO.:  [id number]                   PATIENT TYPE:  AMB   LOCATION:  SDC                                  FACILITY:  [facility]   PHYSICIAN:  James A. Ashley Royalty, M.D.             DATE OF BIRTH:  30-Oct-1970   DATE OF PROCEDURE:  12/25/2003  DATE OF DISCHARGE:                                 OPERATIVE REPORT   PREOPERATIVE DIAGNOSES:  1. Endometriosis.  2. Pelvic pain, probably secondary to #1.  Differential     includes adhesions.   POSTOPERATIVE DIAGNOSES:  1. Endometriosis.  2. Adhesions.   PROCEDURE:  Diagnostic laparoscopy.   SURGEON:  Rudy Jew. Ashley Royalty, M.D.   ANESTHESIA:  General endotracheal anesthesia.   ESTIMATED BLOOD LOSS:  Less than 10 mL.   COMPLICATIONS:  None.   PACKS AND DRAINS:  Foley.   INSTRUMENT COUNT:  Sponge, needle, and instrument count reported as correct  x2.   PROCEDURE IN DETAIL:  The patient was taken to the operating room and placed  in the dorsal supine position.  After adequate general anesthesia was  administered, she was placed in the lithotomy position and prepped and  draped in the usual manner for abdominal and vaginal surgery.  A weighted  speculum was placed per vagina.  The anterior lip of the cervix was grasped  with a single-toothed tenaculum.  Charcot uterine manipulator was placed per  cervix and held in place with the tenaculum.  The bladder was drained with a  red rubber catheter.  Next, a 1.5 cm infraumbilical incision was made in the  longitudinal plane.  Layer by layer, the anterior abdominal wall was  dissected and the peritoneal cavity entered bluntly with the operator's  finger.  Then, 0-Vicryl stay sutures were placed in the fascia and the  Hasson laparoscopic trocar was placed into the abdominal cavity and secured  with the stay sutures.  Laparoscope was introduced into the abdominal  cavity.  CO2 was used to create a pneumoperitoneum which  was maintained  throughout the procedure.  A 5 mm suprapubic trocar was placed in the right  lower quadrant through the patient's old laparotomy incision using direct  visualization and transillumination techniques.  Immediately upon  visualizing the abdomen, there were substantial adhesions noted from the  small bowel and omentum to the anterior abdominal wall including  particularly in the left lower quadrant area.  They were also quite adherent  to the uterus itself making visualization of the pelvis very difficult.  The  right ovary could not be well-visualized due to the fact that it was down  posteriorly in a large adhesion.  The right tube was virtually absent.  The  left adnexa could be visualized partially posterior to the uterus.  It too  was bound by adhesions which obscured the ability to see it fully.  There  appeared to be no evidence of any trauma from the  surgical procedure,  itself.  Interestingly, the operator observed no direct evidence of any  residual endometriosis which was histologically documented heretofore.  The  remainder of the peritoneal surfaces were smooth and glistening.  It was the  operator's judgment that none of the findings was amenable to laparoscopic  treatment.  Hence, the incision was made to terminate the procedure.  The  abdominal instruments were removed and pneumoperitoneum evacuated.  Fascial  defects were closed with 0-Vicryl in an interrupted fashion.  The skin was  closed with 3-0 Monocryl in a subcuticular fashion.  Approximately 10 mL of  0.5% Marcaine with 1:200,000 epinephrine were instilled in the incisions to  aid in postoperative analgesia.  The vaginal instruments were removed,  hemostasis noted, and the procedure terminated.  The patient tolerated the  procedure extremely well and was returned to the recovery room in good  condition.                                               James A. Ashley Royalty, M.D.    JAM/MEDQ  D:   12/25/2003  T:  12/28/2003  Job:  [id number]

## 2010-11-11 NOTE — Discharge Summary (Signed)
NAME:  Kelly Fields, GRACY NO.:  [id number]   MEDICAL RECORD NO.:  [id number]          PATIENT TYPE:  INP   LOCATION:  9318                          FACILITY:  [facility]   PHYSICIAN:  James A. Ashley Royalty, M.D.DATE OF BIRTH:  09-30-1970   DATE OF ADMISSION:  06/03/2004  DATE OF DISCHARGE:  06/07/2004                                 DISCHARGE SUMMARY   DISCHARGE DIAGNOSES:  1.  Endometriosis.  2.  Low back and pelvic pain--right greater than left--probably secondary to      endometriosis.  3.  Status post total abdominal hysterectomy/left salpingo-oophorectomy and      lysis of adhesions as well as appendectomy.   CONSULTATIONS:  Lebron Conners, M.D.   DISCHARGE MEDICATIONS:  Darvocet, Motrin and Phenergan.   HISTORY:  This is a 40 year old nulligravida with a long history of  endometriosis. The patient has undergone numerous treatment modalities  including laparoscopy with exploratory laparotomy and right salpingectomy.  She has also had oral contraceptives in continuous fashion. She has also  been treated with Lupron.  All of the modalities have failed to adequately  control her pelvic discomfort. She __________ desire for definitive therapy  in the form of total abdominal hysterectomy with bilateral oophorectomy and  left salpingectomy.  For the remainder of the history and physical, please  see chart.   HOSPITAL COURSE:  The patient was admitted to [facility]. Admission laboratory studies were withdrawn.  On June 03, 2004, she was taken to the operating room and underwent total abdominal  hysterectomy and left salpingo-oophorectomy.  She also underwent lysis of  adhesions and appendectomy.  Finally, she underwent reinforcement of a  serosal defect of the sigmoid colon. There was no transmural defect or  breech of the GI tract whatsoever.  Dr. Marcy Panning was consulted  intraoperatively to aid in the assessment of the GI tract for any  evidence  of significant trauma.  Though none was found, a small area of the sigmoid  colon was reinforced to ensure against future complications.  In addition,  performed an appendectomy due to the fact that it can often be involved with  endometriosis.  The patient's postoperative course was complicated by a low  grade fever which ultimately resolved.  On June 07, 2004, she was noted  to be afebrile for 48 hours. She was felt to be stable for discharge and was  discharged home in satisfactory condition.  She was asked to call me within  24-48 hours of discharge in order to provide an update.  In addition, she is  to return in six weeks for the formal postoperative visit.  Pathology  returned showing adenomyosis, submucosal leiomyoma and serosal endometriosis  associated with fibrous adhesions. There was also an ovarian hemorrhagic  cyst. The appendix was noted to be benign with acute serositis.   DISPOSITION:  As above.     JAM/MEDQ  D:  07/13/2004  T:  07/13/2004  Job:  [id number]

## 2010-11-11 NOTE — H&P (Signed)
NAME:  VALBORG, FRIAR NO.:  [id number]   MEDICAL RECORD NO.:  [id number]          PATIENT TYPE:  INP   LOCATION:  1824                         FACILITY:  [facility]   PHYSICIAN:  Ulyses Amor, MD DATE OF BIRTH:  1970/07/03   DATE OF ADMISSION:  04/16/2006  DATE OF DISCHARGE:                                HISTORY & PHYSICAL   IDENTIFYING DATA AND CHIEF COMPLAINT:  Kelly Fields is a 40 year old  white woman who is admitted to [facility] for further evaluation of  chest pain.   HISTORY OF THE PRESENT ILLNESS:  This patient had saw Dr. Jacinto Halim recently for  a one to two-month has of a chest pain.  Episodes of chest pain occur nearly  everyday and lasts several hours at a time.  The chest pain is described as  a mid substernal pressure and radiates to her neck bilaterally.  It is  associated with dyspnea and nausea, but no diaphoresis.  Episodes occur at  random and appear not to be related to position, activity, meals or  respirations.  There are no exacerbating or ameliorating factors.  A  Persantine thallium test was performed by Dr. Jacinto Halim and the patient was told  that the results were abnormal, and she was scheduled for cardiac  catheterization three days hence.   The patient has experienced chest pain to since she awoke.  It is similar in  quality and characteristic to her usual chest pain, but it has been more  intense today.  It has continued  unabated for the least 18 hours.   The patient has no documented history of cardiac disease including no  history of myocardial infarction, coronary artery disease, congestive heart  failure or arrhythmias.   CARDIAC RISK FACTORS:  The patient has no risk factors for coronary artery  disease including no history of hypertension, dyslipidemia, diabetes  mellitus, smoking, or family history of coronary artery disease.   PAST MEDICAL HISTORY:  The patient's other medical problems include:  1. Migraine  headaches.  2. Endometriosis.  3. The patient is status post hysterectomy for the latter problem.   PAST SURGICAL HISTORY:  Hysterectomy.   MEDICATIONS:  Topamax.   ALLERGIES:  No known drug allergies.   FAMILY HISTORY:  There is no family history of coronary artery disease nor  is there a history of any other medical problems.   SOCIAL HISTORY:  The patient is single.  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  She does not smoke cigarettes.  She does not drink  alcohol.   REVIEW OF SYSTEMS:  Review of systems reveals no problems related to head,  eyes, ears, nose, mouth, throat, lungs, gastrointestinal system,  genitourinary system, or extremities.  There is no history of neurologic or  psychiatric disorder.  There is no history of fever, chills or weight loss.   PHYSICAL EXAMINATION:  VITAL SIGNS:  Blood pressure 100/60, pulse 69 and  regular, respirations 22 and to 97.0.  GENERAL APPEARANCE:  The patient is a thin young white woman in no  discomfort.  She is alert, oriented, appropriate and  responsive.  HEENT:  Head, eyes, nose and mouth are normal.  NECK:  The neck is without thyromegaly or adenopathy.  Carotid pulses are  palpable and without bruits.  HEART:  Cardiac examination reveals a normal S1 and S2.  There is no S3, S4,  murmur, rub, or click.  CHEST:  No chest wall tenderness is noted.  LUNGS:  The lungs are clear.  ABDOMEN:  The abdomen is  soft and nontender.  There ae no masses,  hepatosplenomegaly, bruit, distention, rebound, guarding , or rigidity.  Bowel sounds are normal.  BREASTS, PELVIC AND RECTAL:  Breast, pelvic and rectal examinations are not  performed as they are not pertinent to the reason for acute care  hospitalization.  EXTREMITIES:  The extremities are without edema, deviation or deformity.  Radial and dorsalis pedis pulses are palpable bilaterally.  NEUROLOGIC EXAMINATION:  Brief screening neurologic survey is unremarkable.    LABORATORY DATA:  The electrocardiogram revealed normal sinus rhythm, a  nonspecific ST abnormality was noted and there were no repolarization  changes specific for ischemia or infarction.  A chest radiograph has not yet  been performed.  Potassium is 3.7, BUN 14 and creatinine 0.8.  The initial  set of cardiac markers revealed a myoglobin of 55.5, CK/MB less than 1.0 and  troponin less than 0.05.  The remaining studies are pending at the time of  this dictation.   IMPRESSION:  1. Chest pain rule out coronary artery disease  and rule out pulmonary      embolus.  2. Migraine headaches.  3. Endometriosis status post hysterectomy.   PLAN:  1. Telemetry.  2. Serial cardiac enzymes.  3. Aspirin.  4. Intravenous heparin.  5. Intravenous nitroglycerin.  6. Fasting lipid profile.  7. Chest CT.  8. Echocardiogram (if this was not done in the office).  9. Further measures will be per Dr. Jacinto Halim.   The patient's sister is present throughout the patient encounter.      Ulyses Amor, MD  Electronically Signed    MSC/MEDQ  D:  04/16/2006  T:  04/17/2006  Job:  [id number]

## 2010-11-11 NOTE — Assessment & Plan Note (Signed)
[facility]                            CARDIOLOGY OFFICE NOTE   Kelly, Fields                 MRN:          [id number]  DATE:07/23/2006                            DOB:          20-Apr-1971    REASON FOR REFERRAL:  Evaluation of chest pain.   CLINICAL HISTORY:  Kelly Fields is 40 years old and is referred from  [facility] for evaluation of chest pain.  Her chest pain has  been present since last September.  She had a fairly extensive  evaluation at [facility] first with a nuclear study,  and an echocardiogram.  A nuclear study apparently did not show any  perfusion defects, but did show EKG changes with adenosine according to  her account.  She was scheduled for a cardiac catheterization, but came  in early because of chest pain.  This was done as an inpatient by Dr.  Jenne Campus.  This showed essentially normal coronary arteries, although  there was some peaking in 1 of the vessels of the circumflex artery,  which was felt not to be clinically significant.  She was started on  Norvasc and may have gotten some improvement with this.  She  subsequently had a second echo that was a bubble study, which showed a  small patent foramen according to her account.  Dr. Jacinto Halim, who saw her  back in followup invited her to participate in a migraine study, but she  declined to do this.  Dr. Sandria Manly has been treating her for her migraines.   Because of persistent symptoms and because of some dissatisfaction with  her care at [facility], she asked for a referral here.   She describes her chest pain as a substernal pressure or aching.  It is  not particularly related to exertion.  Usually, she has some shortness  of breath with it.  She has had some feeling of a fast heartbeat, but  this is not related to the chest pain.   PAST MEDICAL HISTORY:  Significant for 2 laparotomy surgeries in 2003  and 2005.  Also significant for the  migraine headaches, as mentioned  above.  She has also had a previous hysterectomy for endometriosis.   CURRENT MEDICATIONS:  1. Norvasc 2.5 mg daily.  2. Topamax 75 mg once daily for migraines.   FAMILY HISTORY:  Negative for cardiovascular disease.   SOCIAL HISTORY:  She is single.  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   REVIEW OF SYSTEMS:  Positive for symptoms of palpitations.   EXAMINATION:  Her blood pressure is 111/75, pulse 60 and regular.  There was no venous distension.  The carotid pulses are full without  bruits.  CARDIAC:  Rhythm was regular.  I hear no murmurs or gallops.  The first  and second heart sounds are normal.  ABDOMEN:  Soft without organomegaly.  Peripheral pulses are full.  There  were no bruits. Bowel sounds normal.  There was no hepatosplenomegaly.  CHEST:  Clear without rales or rhonchi.  Peripheral pulses were full.  There is no peripheral edema.  MUSCULOSKELETAL:  No deformities.  SKIN:  Warm and dry.  NEUROLOGIC:  No focal neurological signs.   Electrocardiogram which she has had over the past few months shows some  minor nonspecific ST-T changes.  Her lipid profile has shown an HDL of  41 and LDL of 93.   IMPRESSION:  1. Chest pain of uncertain etiology, rule out microvascular angina.  2. History of migraines.  3. Patent foramen ovale per echo by history.  4. Normal coronary angiography.   RECOMMENDATIONS:  Kelly Fields symptoms are somewhat atypical for  ischemia and her risk profile is certainly low, and she had normal  coronary angiography.  It is possible that she could have microvascular  angina since she stated that her ECG became very abnormal with  adenosine, and some of her symptoms appear to be exertionally related.  We will plan to evaluate her with an exercise treadmill to see if she  has chest pain and EKG changes with exercise.  If she does, then she may  meet criteria of microvascular angina, and we can try empiric  therapy  for that.  If she does not, then I think we need to look for non-cardiac  causes for her chest pain.  We will plan to get a sed rate on her as  well to see if there is any evidence of any inflammatory process causing  her symptoms.     Bruce Elvera Lennox Juanda Chance, MD, [facility]  Electronically Signed    BRB/MedQ  DD: 07/23/2006  DT: 07/23/2006  Job #: [id number]   cc:   Jonita Albee, M.D.

## 2011-04-06 LAB — POCT HEMOGLOBIN-HEMACUE
Hemoglobin: 14.3
Operator id: 134391

## 2011-04-12 ENCOUNTER — Other Ambulatory Visit: Payer: Self-pay | Admitting: Obstetrics and Gynecology

## 2011-04-12 DIAGNOSIS — R928 Other abnormal and inconclusive findings on diagnostic imaging of breast: Secondary | ICD-10-CM

## 2011-04-26 ENCOUNTER — Ambulatory Visit
Admission: RE | Admit: 2011-04-26 | Discharge: 2011-04-26 | Disposition: A | Discharge status: Home / Self Care | Payer: BC Managed Care – PPO | Source: Ambulatory Visit | Attending: Obstetrics and Gynecology | Admitting: Obstetrics and Gynecology

## 2011-04-26 DIAGNOSIS — R928 Other abnormal and inconclusive findings on diagnostic imaging of breast: Secondary | ICD-10-CM

## 2011-10-06 DIAGNOSIS — G43809 Other migraine, not intractable, without status migrainosus: Secondary | ICD-10-CM

## 2011-10-06 DIAGNOSIS — F99 Mental disorder, not otherwise specified: Secondary | ICD-10-CM

## 2011-10-18 DIAGNOSIS — G43809 Other migraine, not intractable, without status migrainosus: Secondary | ICD-10-CM

## 2011-10-18 DIAGNOSIS — F99 Mental disorder, not otherwise specified: Secondary | ICD-10-CM

## 2012-06-07 DIAGNOSIS — G250 Essential tremor: Secondary | ICD-10-CM

## 2012-06-07 DIAGNOSIS — R079 Chest pain, unspecified: Secondary | ICD-10-CM | POA: Insufficient documentation

## 2012-06-07 DIAGNOSIS — R5381 Other malaise: Secondary | ICD-10-CM

## 2012-06-07 DIAGNOSIS — R209 Unspecified disturbances of skin sensation: Secondary | ICD-10-CM | POA: Insufficient documentation

## 2012-06-07 DIAGNOSIS — R252 Cramp and spasm: Secondary | ICD-10-CM | POA: Insufficient documentation

## 2012-06-07 DIAGNOSIS — F29 Unspecified psychosis not due to a substance or known physiological condition: Secondary | ICD-10-CM | POA: Insufficient documentation

## 2012-06-07 DIAGNOSIS — R5383 Other fatigue: Secondary | ICD-10-CM | POA: Insufficient documentation

## 2012-06-07 DIAGNOSIS — G4452 New daily persistent headache (NDPH): Secondary | ICD-10-CM | POA: Insufficient documentation

## 2012-06-07 DIAGNOSIS — G252 Other specified forms of tremor: Secondary | ICD-10-CM | POA: Insufficient documentation

## 2012-06-07 DIAGNOSIS — G43009 Migraine without aura, not intractable, without status migrainosus: Secondary | ICD-10-CM | POA: Insufficient documentation

## 2012-10-09 ENCOUNTER — Encounter: Payer: Self-pay | Admitting: Neurology

## 2012-10-09 ENCOUNTER — Other Ambulatory Visit: Payer: Self-pay | Admitting: *Deleted

## 2012-10-15 ENCOUNTER — Ambulatory Visit (INDEPENDENT_AMBULATORY_CARE_PROVIDER_SITE_OTHER): Payer: BC Managed Care – PPO | Admitting: Neurology

## 2012-10-15 ENCOUNTER — Encounter: Payer: Self-pay | Admitting: Neurology

## 2012-10-15 VITALS — BP 112/73 | HR 71 | Ht 63.0 in | Wt 162.0 lb

## 2012-10-15 DIAGNOSIS — R42 Dizziness and giddiness: Secondary | ICD-10-CM | POA: Insufficient documentation

## 2012-10-15 DIAGNOSIS — R209 Unspecified disturbances of skin sensation: Secondary | ICD-10-CM

## 2012-10-15 DIAGNOSIS — G43009 Migraine without aura, not intractable, without status migrainosus: Secondary | ICD-10-CM

## 2012-10-15 NOTE — Progress Notes (Signed)
Reason for visit: Headache  Kelly Fields is an 42 y.o. female  History of present illness:  Kelly Fields is a 42 year old right-handed white female with a history of headaches that date back to her early teenage years. The patient is having 2 or 3 headaches a week, and she indicates that the use of Advil and Imitrex appears to be relatively effective. The patient is on Topamax taking 75 mg at night. The patient will have an occasional shock sensation in the head, and she reports a near daily episodes along with a feeling of imbalance or dizziness. The patient last had MRI evaluation of the brain done in 2012, and she indicates that she did not have dizziness at the time the MRI was done. This MRI study was unremarkable. The patient indicates that the headaches are occipital, and occasionally bifrontal in nature. The headaches are a throbbing quality. The patient has not had any visual disturbance, or nausea or vomiting. The patient does not note any particular activating factors for her headache, but she indicates that MSG could potentially be a source of some of her headache. The patient returns to this office for an evaluation.  Past Medical History  Diagnosis Date  . Migraine   . Tremor   . Endometriosis     Past Surgical History  Procedure Laterality Date  . Abdominal hysterectomy    . Cholecystectomy    . Appendectomy      History reviewed. No pertinent family history.  Social history:  reports that she has never smoked. She does not have any smokeless tobacco history on file. She reports that she does not drink alcohol or use illicit drugs.  Allergies: No Known Allergies  Medications:  Current Outpatient Prescriptions on File Prior to Visit  Medication Sig Dispense Refill  . ibuprofen (ADVIL,MOTRIN) 200 MG tablet Take 200 mg by mouth every 6 (six) hours as needed for pain.      . SUMAtriptan (IMITREX) 100 MG tablet Take 100 mg by mouth as needed for migraine. One for  headache, no more than 3 per week      . topiramate (TOPAMAX) 25 MG tablet Take 25 mg by mouth daily. Take three (3) tablets at once, one time daily       No current facility-administered medications on file prior to visit.    ROS:  Out of a complete 14 system review of symptoms, the patient complains only of the following symptoms, and all other reviewed systems are negative.  Fatigue Blurred vision Feeling hot, cold Cramps, muscle aches Allergies Memory loss, confusion, headache, tremor  Blood pressure 112/73, pulse 71, height 5\' 3"  (1.6 m), weight 162 lb (73.483 kg).  Physical Exam  General: The patient is alert and cooperative at the time of the examination. The patient is minimally obese.  Neuromuscular: The patient has good range of movement of the cervical spine.  Skin: No significant peripheral edema is noted.   Neurologic Exam  Cranial nerves: Facial symmetry is present. Speech is normal, no aphasia or dysarthria is noted. Extraocular movements are full. Visual fields are full.  Motor: The patient has good strength in all 4 extremities.  Coordination: The patient has good finger-nose-finger and heel-to-shin bilaterally.  Gait and station: The patient has a normal gait. Tandem gait is normal. Romberg is negative. No drift is seen.  Reflexes: Deep tendon reflexes are symmetric.   Assessment/Plan:  1. Migraine headache  2. Episodic dizziness  The patient will be sent for MRI  evaluation to evaluate the dizziness issues. The patient will have blood work done today. The patient will followup otherwise in 6 months. The patient indicates that she cannot take Imitrex at work as it makes her feel generally weak. The patient may try taking just one half tablet for her headaches while at work.  Marlan Palau MD 10/15/2012 8:12 PM  [facility] [address] [place], [place] [address]  Phone [phone] Fax [phone]

## 2012-10-16 LAB — VITAMIN B12: Vitamin B-12: 301 pg/mL (ref 211–946)

## 2012-10-16 LAB — TSH: TSH: 2.62 u[IU]/mL (ref 0.450–4.500)

## 2012-10-17 NOTE — Progress Notes (Signed)
Quick Note:  I called and spoke with the patient concerning her lab results being normal. ______ 

## 2012-10-23 ENCOUNTER — Ambulatory Visit (INDEPENDENT_AMBULATORY_CARE_PROVIDER_SITE_OTHER): Payer: BC Managed Care – PPO

## 2012-10-23 DIAGNOSIS — R209 Unspecified disturbances of skin sensation: Secondary | ICD-10-CM

## 2012-10-23 DIAGNOSIS — G43009 Migraine without aura, not intractable, without status migrainosus: Secondary | ICD-10-CM

## 2012-10-23 DIAGNOSIS — R42 Dizziness and giddiness: Secondary | ICD-10-CM

## 2012-10-24 ENCOUNTER — Telehealth: Payer: Self-pay | Admitting: Neurology

## 2012-10-24 NOTE — Procedures (Signed)
[facility]  NEUROIMAGING REPORT   STUDY DATE: 10/23/12 PATIENT NAME: Kelly Fields DOB: February 07, 1971 MRN: [id number]  ORDERING CLINICIAN: York Spaniel, MD  CLINICAL HISTORY: 42 year old female with numbess and headaches.  EXAM: MRI brain (without)  TECHNIQUE: MRI of the brain without contrast was obtained utilizing 5 mm axial slices with T1, T2, T2 flair, T2 star gradient echo and diffusion weighted views.  T1 sagittal and T2 coronal views were obtained. CONTRAST: no IMAGING SITE: [facility] [address]   FINDINGS:  No abnormal lesions are seen on diffusion-weighted views to suggest acute ischemia. The cortical sulci, fissures and cisterns are normal in size and appearance. Lateral, third and fourth ventricle are normal in size and appearance. No extra-axial fluid collections are seen. No evidence of mass effect or midline shift.  There are 4 punctate nonspecific foci of gliosis in the subcortical white matter.  On sagittal views the posterior fossa, pituitary gland and corpus callosum are unremarkable. No evidence of intracranial hemorrhage on gradient-echo views. The orbits and their contents, paranasal sinuses and calvarium are unremarkable.  Intracranial flow voids are present.  IMPRESSION:  Equivocal MRI brain (with and without) demonstrating 4 punctate nonspecific foci of gliosis in the subcortical white matter. No acute findings.   INTERPRETING PHYSICIAN:  Suanne Marker, MD Certified in Neurology, Neurophysiology and Neuroimaging  [facility] [address] [place], [place] [address] [phone]

## 2012-10-24 NOTE — Telephone Encounter (Signed)
I called patient. The MRI the brain shows 4 punctate white matter lesions. These lesions are consistent with the diagnosis of migraine headache. I discussed this with the patient.

## 2013-04-11 ENCOUNTER — Encounter: Payer: Self-pay | Admitting: Nurse Practitioner

## 2013-04-11 ENCOUNTER — Ambulatory Visit (INDEPENDENT_AMBULATORY_CARE_PROVIDER_SITE_OTHER): Payer: BC Managed Care – PPO | Admitting: Nurse Practitioner

## 2013-04-11 VITALS — BP 103/59 | HR 67 | Temp 98.4°F | Ht 63.0 in | Wt 169.0 lb

## 2013-04-11 DIAGNOSIS — R42 Dizziness and giddiness: Secondary | ICD-10-CM

## 2013-04-11 DIAGNOSIS — R5381 Other malaise: Secondary | ICD-10-CM

## 2013-04-11 DIAGNOSIS — G43009 Migraine without aura, not intractable, without status migrainosus: Secondary | ICD-10-CM

## 2013-04-11 MED ORDER — SUMATRIPTAN SUCCINATE 100 MG PO TABS: 100.0000 mg | ORAL_TABLET | ORAL | Status: DC | PRN

## 2013-04-11 MED ORDER — TOPIRAMATE 25 MG PO TABS: 75.0000 mg | ORAL_TABLET | Freq: Every day | ORAL | Status: DC

## 2013-04-11 NOTE — Patient Instructions (Signed)
Continue Topamax  For headache prophylaxis.  Continue Imitrex for acute Migraine.  Samples of Cambia powders given, if it works well, call office for Rx.  Follow up in 6 months, sooner as needed.

## 2013-04-11 NOTE — Progress Notes (Signed)
I have read the note, and I agree with the clinical assessment and plan.  WILLIS,CHARLES KEITH   

## 2013-04-11 NOTE — Progress Notes (Signed)
Physical Exam General: The patient is alert and cooperative at the time of the examination. The patient is minimally obese. Neuromuscular: The patient has good range of movement of the cervical spine. Skin: No significant peripheral edema is noted.  Neurologic Exam Cranial nerves: Facial symmetry is present. Speech is normal, no aphasia or dysarthria is noted. Extraocular movements are full. Visual fields are full. Motor: The patient has good strength in all 4 extremities. Coordination: The patient has good finger-nose-finger and heel-to-shin bilaterally. Gait and station: The patient has a normal gait. Tandem gait is normal. Romberg is negative. No drift is seen. Reflexes: Deep tendon reflexes are symmetric.     [facility]  PATIENT: ALEC MCPHEE DOB: 1970-12-02   REASON FOR VISIT: follow up HISTORY FROM: patient  HISTORY OF PRESENT ILLNESS: 10/15/12 : [name]):  Ms. Wohlers is a 42 year old right-handed white female with a history of headaches that date back to her early teenage years. The patient is having 2 or 3 headaches a week, and she indicates that the use of Advil and Imitrex appears to be relatively effective. The patient is on Topamax taking 75 mg at night. The patient will have an occasional shock sensation in the head, and she reports a near daily episodes along with a feeling of imbalance or dizziness. The patient last had MRI evaluation of the brain done in 2012, and she indicates that she did not have dizziness at the time the MRI was done. This MRI study was unremarkable. The patient indicates that the headaches are occipital, and occasionally bifrontal in nature. The headaches are a throbbing quality. The patient has not had any visual disturbance, or nausea or vomiting. The patient does not note any particular activating factors for her headache, but she indicates that MSG could potentially be a source of some of her headache. The patient returns  to this office for an evaluation.  UPDATE 04/11/13 (LL):  Patient returns to office for headache revisit. The MRI the brain showed 4 punctate white matter lesions. These lesions are consistent with the diagnosis of migraine headache.  Her headaches are mostly without aura.  She reports that her headaches are much less frequent with Topamax, she is averaging only 1 per month. She takes the Topamax in the morning. She has milder tension headaches occasionally but they do not interfere with her job performance.  Imitrex works well to stop migraines for her.  She works as a Optometrist for [employer].  She complains of feeling tired all of the time.  ROS: Out of a complete 14 system review of symptoms, the patient complains only of the following symptoms, and all other reviewed systems are negative.  Fatigue Blurred vision headache, tremor  ALLERGIES: No Known Allergies  HOME MEDICATIONS: Outpatient Prescriptions Prior to Visit  Medication Sig Dispense Refill  . ibuprofen (ADVIL,MOTRIN) 200 MG tablet Take 200 mg by mouth every 6 (six) hours as needed for pain.      . SUMAtriptan (IMITREX) 100 MG tablet Take 100 mg by mouth as needed for migraine. One for headache, no more than 3 per week      . topiramate (TOPAMAX) 25 MG tablet Take 25 mg by mouth daily. Take three (3) tablets at once, one time daily       No facility-administered medications prior to visit.    PAST MEDICAL HISTORY: Past Medical History  Diagnosis Date  . Migraine   . Tremor   . Endometriosis  PAST SURGICAL HISTORY: Past Surgical History  Procedure Laterality Date  . Abdominal hysterectomy    . Cholecystectomy    . Appendectomy      FAMILY HISTORY: No family history on file.  SOCIAL HISTORY: History   Social History  . Marital Status: Single    Spouse Name: N/A    Number of Children: 0  . Years of Education: College   Occupational History  . Teacher     [employer]    Social History Main Topics  . Smoking status: Never Smoker   . Smokeless tobacco: Not on file  . Alcohol Use: No  . Drug Use: No  . Sexual Activity: No   Other Topics Concern  . Not on file   Social History Narrative  . No narrative on file   PHYSICAL EXAM  Filed Vitals:   04/11/13 0831  BP: 103/59  Pulse: 67  Temp: 98.4 F (36.9 C)  TempSrc: Oral  Height: 5\' 3"  (1.6 m)  Weight: 169 lb (76.658 kg)   Body mass index is 29.94 kg/(m^2).  Generalized: Well developed, in no acute distress  Head: normocephalic and atraumatic. Oropharynx benign  Neck: Supple, no carotid bruits  Cardiac: Regular rate rhythm, no murmur  Musculoskeletal: No deformity   Neurological examination  Mentation: Alert oriented to time, place, history taking. Follows all commands speech and language fluent Cranial nerve II-XII:   Pupils were equal round reactive to light extraocular movements were full, visual field were full on confrontational test. Facial sensation and strength were normal. hearing was intact to finger rubbing bilaterally. Uvula tongue midline. head turning and shoulder shrug and were normal and symmetric.Tongue protrusion into cheek strength was normal. Motor: normal bulk and tone, full strength in the BUE, BLE, fine finger movements normal, no pronator drift. No focal weakness, mild action tremor in Right hand > left. Sensory: normal and symmetric to light touch, pinprick Coordination: finger-nose-finger, heel-to-shin bilaterally, no dysmetria Reflexes: Brachioradialis 2/2, biceps 2/2, triceps 2/2, patellar 2/2, Achilles 2/2, plantar responses were flexor bilaterally. Gait and Station: Rising up from seated position without assistance, normal stance, without trunk ataxia, moderate stride, good arm swing, smooth turning, able to perform tiptoe, and heel walking without difficulty.   DIAGNOSTIC DATA (LABS, IMAGING, TESTING) - I reviewed patient records, labs, notes, testing and imaging  myself where available.  Lab Results  Component Value Date   VITAMINB12 301 10/15/2012   Lab Results  Component Value Date   TSH 2.620 10/15/2012    ASSESSMENT AND PLAN  42 y.o. year old female  has a past medical history of Migraine; Tremor; and Endometriosis. here with Migraine headaches.  1. Migraine headache 2. Episodic dizziness 3. Fatigue  The patient will followup otherwise in 6 months. The patient indicates that she cannot take Imitrex at work as it makes her feel generally weak. The patient may try taking just one half tablet for her headaches while at work. 2 boxes of Cambia samples given to try, may be a option for headache while at work. Recommended B12 vitamin supplement for fatigue, B12 lab results towards low side of normal.  Meds ordered this encounter  Medications  . topiramate (TOPAMAX) 25 MG tablet    Sig: Take 3 tablets (75 mg total) by mouth daily. Take three (3) tablets at once, one time daily    Dispense:  270 tablet    Refill:  1    Order Specific Question:  Supervising Provider    Answer:  Anne Hahn,  CHARLES K [[id number]]  . SUMAtriptan (IMITREX) 100 MG tablet    Sig: Take 1 tablet (100 mg total) by mouth as needed for migraine. One for headache, no more than 3 per week    Dispense:  10 tablet    Refill:  5    Order Specific Question:  Supervising Provider    Answer:  York Spaniel [[id number]]    Tawny Asal LAM, MSN, NP-C 04/11/2013, 9:15 AM [facility] [address] [place], [place] [address] [phone]

## 2013-05-01 ENCOUNTER — Other Ambulatory Visit: Payer: Self-pay

## 2013-07-30 ENCOUNTER — Other Ambulatory Visit: Payer: Self-pay

## 2013-07-30 DIAGNOSIS — Z1231 Encounter for screening mammogram for malignant neoplasm of breast: Secondary | ICD-10-CM

## 2013-08-19 ENCOUNTER — Ambulatory Visit: Payer: BC Managed Care – PPO

## 2013-10-10 ENCOUNTER — Ambulatory Visit (INDEPENDENT_AMBULATORY_CARE_PROVIDER_SITE_OTHER): Payer: BC Managed Care – PPO | Admitting: Nurse Practitioner

## 2013-10-10 ENCOUNTER — Encounter: Payer: Self-pay | Admitting: Nurse Practitioner

## 2013-10-10 VITALS — BP 127/77 | HR 77 | Ht 63.0 in | Wt 172.3 lb

## 2013-10-10 DIAGNOSIS — G43009 Migraine without aura, not intractable, without status migrainosus: Secondary | ICD-10-CM

## 2013-10-10 MED ORDER — SUMATRIPTAN SUCCINATE 100 MG PO TABS: 100.0000 mg | ORAL_TABLET | ORAL | Status: DC | PRN

## 2013-10-10 NOTE — Progress Notes (Signed)
I have read the note, and I agree with the clinical assessment and plan.  Charles K Willis   

## 2013-10-10 NOTE — Progress Notes (Signed)
PATIENT: Kelly Fields DOB: Aug 22, 1970  REASON FOR VISIT: follow up for Migraine HISTORY FROM: patient  HISTORY OF PRESENT ILLNESS: 10/15/12 : Kelly Fields(KW): Ms. Kelly Fields is a 43 year old right-handed white female with a history of headaches that date back to her early teenage years. The patient is having 2 or 3 headaches a week, and she indicates that the use of Advil and Imitrex appears to be relatively effective. The patient is on Topamax taking 75 mg at night. The patient will have an occasional shock sensation in the head, and she reports a near daily episodes along with a feeling of imbalance or dizziness. The patient last had MRI evaluation of the brain done in 2012, and she indicates that she did not have dizziness at the time the MRI was done. This MRI study was unremarkable. The patient indicates that the headaches are occipital, and occasionally bifrontal in nature. The headaches are a throbbing quality. The patient has not had any visual disturbance, or nausea or vomiting. The patient does not note any particular activating factors for her headache, but she indicates that MSG could potentially be a source of some of her headache. The patient returns to this office for an evaluation.   U10/17/14 (LL): Patient returns to office for headache revisit. The MRI the brain showed 4 punctate white matter lesions. These lesions are consistent with the diagnosis of migraine headache. Her headaches are mostly without aura. She reports that her headaches are much less frequent with Topamax, she is averaging only 1 per month. She takes the Topamax in the morning. She has milder tension headaches occasionally but they do not interfere with her job performance. Imitrex works well to stop migraines for her. 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 She complains of feeling tired all of the time.   UPDATE 10/10/13 (LL): Patient returns for revisit for Migraines.  She has not had any Migraine  in over 6 months and has weaned off of Topamax several months ago.  Doing well.  ROS:  Out of a complete 14 system review of symptoms, the patient complains only of the following symptoms, and all other reviewed systems are negative.  No complaints  ALLERGIES: No Known Allergies  HOME MEDICATIONS: Outpatient Prescriptions Prior to Visit  Medication Sig Dispense Refill  . ibuprofen (ADVIL,MOTRIN) 200 MG tablet Take 200 mg by mouth every 6 (six) hours as needed for pain.      . SUMAtriptan (IMITREX) 100 MG tablet Take 1 tablet (100 mg total) by mouth as needed for migraine. One for headache, no more than 3 per week  10 tablet  5  . topiramate (TOPAMAX) 25 MG tablet Take 3 tablets (75 mg total) by mouth daily. Take three (3) tablets at once, one time daily  270 tablet  1   No facility-administered medications prior to visit.   PHYSICAL EXAM  Filed Vitals:   10/10/13 1535  BP: 127/77  Pulse: 77  Height: 5\' 3"  (1.6 m)  Weight: 172 lb 4.8 oz (78.155 kg)   Body mass index is 30.53 kg/(m^2).  Generalized: Well developed, in no acute distress  Head: normocephalic and atraumatic. Oropharynx benign  Neck: Supple, no carotid bruits  Cardiac: Regular rate rhythm, no murmur  Musculoskeletal: No deformity   Neurological examination  Mentation: Alert oriented to time, place, history taking. Follows all commands speech and language fluent  Cranial nerve II-XII: Pupils were equal round reactive to light extraocular movements were full, visual field  were full on confrontational test. Facial sensation and strength were normal. hearing was intact to finger rubbing bilaterally. Uvula tongue midline. head turning and shoulder shrug and were normal and symmetric.Tongue protrusion into cheek strength was normal.  Motor: normal bulk and tone, full strength in the BUE, BLE, fine finger movements normal, no pronator drift. No focal weakness, mild action tremor in Right hand > left.  Sensory: normal and  symmetric to light touch, pinprick  Coordination: finger-nose-finger, heel-to-shin bilaterally, no dysmetria  Reflexes: Brachioradialis 2/2, biceps 2/2, triceps 2/2, patellar 2/2, Achilles 2/2, plantar responses were flexor bilaterally.  Gait and Station: Rising up from seated position without assistance, normal stance, without trunk ataxia, moderate stride, good arm swing, smooth turning, able to perform tiptoe, and heel walking without difficulty.   ASSESSMENT AND PLAN 43 y.o. year old female has a past medical history of Migraine; Tremor; and Endometriosis here with Migraine headaches. Patient has not had any Migraine for over 6 months now and has weaned herself off of Topamax.  1. Migraine headache - in remission  PLAN: Continue Imitrex prn for acute Migraine, refills sent. Follow up in 1 year, sooner as needed.  Meds ordered this encounter  Medications  . SUMAtriptan (IMITREX) 100 MG tablet    Sig: Take 1 tablet (100 mg total) by mouth as needed for migraine. One for headache, no more than 3 per week    Dispense:  10 tablet    Refill:  11    Order Specific Question:  Supervising Provider    Answer:  York SpanielWILLIS, CHARLES K [[id number]]   Return in about 1 year (around 10/11/2014).  Ronal FearLYNN E. Jaimie Pippins, MSN, NP-C 10/10/2013, 3:58 PM [facility] [address], [address] [place], [place] [address] [phone]  Note: This document was prepared with digital dictation and possible smart phrase technology. Any transcriptional errors that result from this process are unintentional.

## 2013-10-10 NOTE — Patient Instructions (Signed)
New Rx for Imitrex was sent to [facility] with refills.  Follow up in 1 year, sooner as needed.

## 2013-11-19 ENCOUNTER — Telehealth: Payer: Self-pay

## 2013-11-19 NOTE — Telephone Encounter (Signed)
[facility] is sending [place] faxes saying a prior Berkley Harvey is required on Sumatriptan.  We have contacted the insurance, and they indicate no PA is required.  The pharmacy is not processing the Rx correctly.  Case # [id number].  I have relayed this info to the pharmacy and asked that they contact ins if they are still having problems processing this Rx.

## 2014-04-23 ENCOUNTER — Other Ambulatory Visit: Payer: Self-pay | Admitting: Family Medicine

## 2014-04-23 DIAGNOSIS — R202 Paresthesia of skin: Secondary | ICD-10-CM

## 2014-04-26 ENCOUNTER — Ambulatory Visit
Admission: RE | Admit: 2014-04-26 | Discharge: 2014-04-26 | Disposition: A | Discharge status: Home / Self Care | Payer: BC Managed Care – PPO | Source: Ambulatory Visit | Attending: Family Medicine | Admitting: Family Medicine

## 2014-04-26 DIAGNOSIS — R202 Paresthesia of skin: Secondary | ICD-10-CM

## 2014-05-05 ENCOUNTER — Other Ambulatory Visit: Payer: Self-pay | Admitting: Obstetrics and Gynecology

## 2014-05-06 LAB — CYTOLOGY - PAP

## 2014-05-19 ENCOUNTER — Ambulatory Visit (INDEPENDENT_AMBULATORY_CARE_PROVIDER_SITE_OTHER): Payer: BC Managed Care – PPO | Admitting: Neurology

## 2014-05-19 ENCOUNTER — Encounter: Payer: Self-pay | Admitting: Neurology

## 2014-05-19 VITALS — BP 117/89 | HR 69 | Ht 63.0 in | Wt 187.4 lb

## 2014-05-19 DIAGNOSIS — G43009 Migraine without aura, not intractable, without status migrainosus: Secondary | ICD-10-CM

## 2014-05-19 DIAGNOSIS — R531 Weakness: Secondary | ICD-10-CM

## 2014-05-19 DIAGNOSIS — R209 Unspecified disturbances of skin sensation: Secondary | ICD-10-CM

## 2014-05-19 NOTE — Progress Notes (Signed)
Reason for visit: Paresthesias and weakness  Kelly Fields is a 43 y.o. female  History of present illness:  Kelly Fields is a 43 year old right-handed white female with a history of migraine headaches. The patient has been seen previously through this office for her migraine. Beginning in October 2015, she began having some discomfort and sensations of weakness and fatigue and sensory changes involving the right arm and right leg. The patient has also noted some sensory changes involving the left leg as well. She has had some tingling on the nose on the left, and sensations about the left side of her head. She has also reported some trouble with memory and concentration, word finding problems. She notes some slight imbalance issues, but no falls. She denies issues controlling the bowels or the bladder, and she denies any visual field changes or double vision. She does have some discomfort in the right leg, and muscle soreness in the upper arm on the right. She has undergone MRI evaluation of the cervical spine that shows possible impingement of the C6 nerve roots on both sides, but no evidence of spinal cord impingement or demyelinating disease. She is sent to this office for further evaluation. Blood work has been done that included a vitamin B12 level and sedimentation rate that were unremarkable.  Past Medical History  Diagnosis Date  . Migraine   . Tremor   . Endometriosis     Past Surgical History  Procedure Laterality Date  . Abdominal hysterectomy    . Cholecystectomy    . Appendectomy      History reviewed. No pertinent family history.  Social history:  reports that she has never smoked. She has never used smokeless tobacco. She reports that she does not drink alcohol or use illicit drugs.  Medications:  Current Outpatient Prescriptions on File Prior to Visit  Medication Sig Dispense Refill  . ibuprofen (ADVIL,MOTRIN) 200 MG tablet Take 200 mg by mouth every 6 (six)  hours as needed for pain.    . SUMAtriptan (IMITREX) 100 MG tablet Take 1 tablet (100 mg total) by mouth as needed for migraine. One for headache, no more than 3 per week 10 tablet 11   No current facility-administered medications on file prior to visit.     No Known Allergies  ROS:  Out of a complete 14 system review of symptoms, the patient complains only of the following symptoms, and all other reviewed systems are negative.  Fatigue Achy muscles, muscle cramps, neck stiffness Dizziness  Blood pressure 117/89, pulse 69, height 5\' 3"  (1.6 m), weight 187 lb 6.4 oz (85.004 kg).  Physical Exam  General: The patient is alert and cooperative at the time of the examination. The patient is moderately obese.  Eyes: Pupils are equal, round, and reactive to light. Discs are flat bilaterally.  Neck: The neck is supple, no carotid bruits are noted.  Respiratory: The respiratory examination is clear.  Cardiovascular: The cardiovascular examination reveals a regular rate and rhythm, no obvious murmurs or rubs are noted.  Neuromuscular: Range of movement of the cervical spine is full.  Skin: Extremities are without significant edema.  Neurologic Exam  Mental status: The patient is alert and oriented x 3 at the time of the examination. The patient has apparent normal recent and remote memory, with an apparently normal attention span and concentration ability.  Cranial nerves: Facial symmetry is present. There is good sensation of the face to pinprick and soft touch bilaterally. The strength of  the facial muscles and the muscles to head turning and shoulder shrug are normal bilaterally. Speech is well enunciated, no aphasia or dysarthria is noted. Extraocular movements are full. Visual fields are full. The tongue is midline, and the patient has symmetric elevation of the soft palate. No obvious hearing deficits are noted.  Motor: The motor testing reveals 5 over 5 strength of all 4  extremities. Good symmetric motor tone is noted throughout.  Sensory: Sensory testing is intact to pinprick, soft touch, vibration sensation, and position sense on all 4 extremities. No evidence of extinction is noted.  Coordination: Cerebellar testing reveals good finger-nose-finger and heel-to-shin bilaterally.  Gait and station: Gait is normal. Tandem gait is normal. Romberg is negative. No drift is seen.  Reflexes: Deep tendon reflexes are symmetric and normal bilaterally. Toes are downgoing bilaterally.    MRI cervical 04/26/14:  IMPRESSION: No evidence of cord compression or cord lesion.  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 Either C6 nerve root could be affected, more likely the left.  Mild, non-compressive spondylosis at C3-4, C4-5 and C6-7.   MRI brain 10/24/12:  IMPRESSION:  Equivocal MRI brain (with and without) demonstrating 4 punctate  nonspecific foci of gliosis in the subcortical white matter. No  acute findings.     Assessment/Plan:  1. Reports of weakness, sensory changes  2. History of migraine headache  The patient reports new symptoms of primarily right-sided weakness and fatigue, sensory changes, and discomfort. The patient has had an unremarkable MRI of the cervical spine that does not explain the symptoms. The patient will be set up for MRI of the brain to exclude demyelinating disease, and she will be sent for further blood work testing today. She will follow-up in about 3 months, sooner depending upon the results of the above. The clinical examination is completely normal.  Kelly Palau. Keith Linden Tagliaferro MD 05/19/2014 6:55 PM  [facility] [address] [place], [place] [id number]  Phone [phone] Fax [phone]

## 2014-05-19 NOTE — Patient Instructions (Signed)

## 2014-06-04 ENCOUNTER — Ambulatory Visit (INDEPENDENT_AMBULATORY_CARE_PROVIDER_SITE_OTHER): Payer: BC Managed Care – PPO

## 2014-06-04 DIAGNOSIS — R531 Weakness: Secondary | ICD-10-CM

## 2014-06-04 DIAGNOSIS — R209 Unspecified disturbances of skin sensation: Secondary | ICD-10-CM

## 2014-06-04 DIAGNOSIS — G43009 Migraine without aura, not intractable, without status migrainosus: Secondary | ICD-10-CM

## 2014-06-04 MED ORDER — GADOPENTETATE DIMEGLUMINE 469.01 MG/ML IV SOLN: 17.0000 mL | Freq: Once | INTRAVENOUS | Status: AC | PRN

## 2014-06-08 ENCOUNTER — Telehealth: Payer: Self-pay | Admitting: Neurology

## 2014-06-08 NOTE — Telephone Encounter (Signed)
I called patient. 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 I discussed this with the patient.   MRI brain 06/08/2014:  IMPRESSION:  Mildly abnormal MRI brain (with and without) demonstrating: 1. There are 4 small, punctate foci of non-specific gliosis in the periventricular and subcortical white matter. None of these lesions have abnormal enhancement. These findings are non-specific and considerations include autoimmune, inflammatory, post-infectious, microvascular ischemic or migraine associated etiologies.  2. Separately, there is a different small focus (6mm) of enhancement in the right parasagittal, frontal juxtacortical region seen on coronal T1 post views (series 12 image 15), but not seen on axial T1 views or T2FLAIR views (axial nor sagittal). 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

## 2014-06-09 LAB — ACETYLCHOLINE RECEPTOR, BINDING: AChR Binding Ab, Serum: <0.03 nmol/L (ref 0.00–0.24)

## 2014-06-09 LAB — RHEUMATOID FACTOR: RHEUMATOID FACTOR: 10.7 [IU]/mL (ref 0.0–13.9)

## 2014-06-09 LAB — COPPER, SERUM: Copper: 122 ug/dL (ref 72–166)

## 2014-06-09 LAB — ANA W/REFLEX: ANA: NEGATIVE

## 2014-06-09 LAB — LYME, TOTAL AB TEST/REFLEX: Lyme IgG/IgM Ab: <0.91 {ISR} (ref 0.00–0.90)

## 2014-06-09 LAB — ANGIOTENSIN CONVERTING ENZYME: Angio Convert Enzyme: 33 U/L (ref 14–82)

## 2014-08-31 ENCOUNTER — Encounter: Payer: Self-pay | Admitting: Adult Health

## 2014-08-31 ENCOUNTER — Ambulatory Visit (INDEPENDENT_AMBULATORY_CARE_PROVIDER_SITE_OTHER): Payer: BC Managed Care – PPO | Admitting: Adult Health

## 2014-08-31 VITALS — BP 132/88 | HR 82 | Ht 63.0 in | Wt 187.0 lb

## 2014-08-31 DIAGNOSIS — R209 Unspecified disturbances of skin sensation: Secondary | ICD-10-CM

## 2014-08-31 DIAGNOSIS — G43009 Migraine without aura, not intractable, without status migrainosus: Secondary | ICD-10-CM

## 2014-08-31 DIAGNOSIS — G43909 Migraine, unspecified, not intractable, without status migrainosus: Secondary | ICD-10-CM | POA: Insufficient documentation

## 2014-08-31 DIAGNOSIS — R531 Weakness: Secondary | ICD-10-CM

## 2014-08-31 NOTE — Patient Instructions (Signed)
Please let us know if your symptoms worsen.  If you begin to have migraines please let us know.  Stretch daily.

## 2014-08-31 NOTE — Progress Notes (Signed)
I have read the note, and I agree with the clinical assessment and plan.  Malia Corsi KEITH   

## 2014-08-31 NOTE — Progress Notes (Signed)
PATIENT: Kelly Fields DOB: April 22, 1971  REASON FOR VISIT: follow up- migraine headaches, right-sided weakness and sensory changes HISTORY FROM: patient  HISTORY OF PRESENT ILLNESS: Miss Kelly Fields is a 44 year old female with a history of migraine headaches and weakness and sensory changes on the right side. She returns today for follow-up. The patient has had an MRI that showed white matter changes consistent with her migraines. The patient states that her migraine headaches have improved. She very rarely has a true migraine. However she does have sinus headaches. She states that she has at least one headache a week. She takes Claritin and naproxen and this seems to work well for her. She feels that she may have an allergy to grass. The patient states that in the last week she's had right thigh weakness. She describes it as a "Jell-O" feeling that starts in the thigh area and radiates to the lower leg. She reports the same type of pain in the right shoulder. She describes it as if she has overworked that shoulder. She continues to have trouble with her memory. She describes it as issues with word finding. She states that occasionally she will substitute the wrong word. She denies any troubles with the bowels or bladder. Denies any changes with her vision. Denies any trouble with her gait or balance. Although she states that sometimes she feels like her balance is off. Denies any falls. Overall she feels that she has remained the same. No new medical issues. No new neurological complaints. Kelly Fields Kitchen. HISTORY 05/19/14 (WILLIS): Ms. Kelly Fields is a 44 year old right-handed white female with a history of migraine headaches. The patient has been seen previously through this office for her migraine. Beginning in October 2015, she began having some discomfort and sensations of weakness and fatigue and sensory changes involving the right arm and right leg. The patient has also noted some sensory changes involving  the left leg as well. She has had some tingling on the nose on the left, and sensations about the left side of her head. She has also reported some trouble with memory and concentration, word finding problems. She notes some slight imbalance issues, but no falls. She denies issues controlling the bowels or the bladder, and she denies any visual field changes or double vision. She does have some discomfort in the right leg, and muscle soreness in the upper arm on the right. She has undergone MRI evaluation of the cervical spine that shows possible impingement of the C6 nerve roots on both sides, but no evidence of spinal cord impingement or demyelinating disease. She is sent to this office for further evaluation. Blood work has been done that included a vitamin B12 level and sedimentation rate that were unremarkable.  REVIEW OF SYSTEMS: Out of a complete 14 system review of symptoms, the patient complains only of the following symptoms, and all other reviewed systems are negative.  See history of present illness  ALLERGIES: No Known Allergies  HOME MEDICATIONS: Outpatient Prescriptions Prior to Visit  Medication Sig Dispense Refill  . ibuprofen (ADVIL,MOTRIN) 200 MG tablet Take 200 mg by mouth every 6 (six) hours as needed for pain.    . naproxen (NAPROSYN) 500 MG tablet Take 500 mg by mouth every 12 (twelve) hours as needed. for pain  1  . SUMAtriptan (IMITREX) 100 MG tablet Take 1 tablet (100 mg total) by mouth as needed for migraine. One for headache, no more than 3 per week 10 tablet 11   No facility-administered medications prior  to visit.    PAST MEDICAL HISTORY: Past Medical History  Diagnosis Date  . Migraine   . Tremor   . Endometriosis     PAST SURGICAL HISTORY: Past Surgical History  Procedure Laterality Date  . Abdominal hysterectomy    . Cholecystectomy    . Appendectomy      FAMILY HISTORY: Family History  Problem Relation Age of Onset  . Neuropathy Mother   .  Restless legs syndrome Mother   . Transient ischemic attack Father     SOCIAL HISTORY: History   Social History  . Marital Status: Single    Spouse Name: N/A  . Number of Children: 0  . Years of Education: College   Occupational History  . Teacher     [employer]   Social History Main Topics  . Smoking status: Never Smoker   . Smokeless tobacco: Never Used  . Alcohol Use: No  . Drug Use: No  . Sexual Activity: No   Other Topics Concern  . Not on file   Social History Narrative   Patient lives at home and her parent's live with her.    Patient works full time Optometrist.   Education Psychologist, counselling.   Right handed.   Caffeine two cups daily.      PHYSICAL EXAM  Filed Vitals:   08/31/14 0849  BP: 132/88  Pulse: 82  Height:  (1Kelly6 m)  Weight: 187 lb (84Kelly823 kg)   Body mass index is 33Kelly13 kg/(m^2).  Generalized: Well developed, in no acute distress   Neurological examination  Mentation: Alert oriented to time, place, history taking. Follows all commands speech and language fluent Cranial nerve II-XII: Pupils were equal round reactive to light. Extraocular movements were full, visual field were full on confrontational test. Facial sensation and strength were normal. Uvula tongue midline. Head turning and shoulder shrug  were normal and symmetric. Motor: The motor testing reveals 5 over 5 strength of all 4 extremities. Good symmetric motor tone is noted throughout.  Sensory: Sensory testing is intact to soft touch on all 4 extremities. No evidence of extinction is noted.  Coordination: Cerebellar testing reveals good finger-nose-finger and heel-to-shin bilaterally.  Gait and station: Gait is normal. Tandem gait is normal. Romberg is negative. No drift is seen.  Reflexes: Deep tendon reflexes are symmetric and normal bilaterally.    DIAGNOSTIC DATA (LABS, IMAGING, TESTING) - I reviewed patient records, labs, notes, testing and imaging myself where  available.  ASSESSMENT AND PLAN 44 yKellyo. year old female  has a past medical history of Migraine; Tremor; and Endometriosis. here with:  1. Migraines 2. Disturbance of skin sensation 3. Weakness- right side  The patient reports that her migraines have improved. She now has sinus headaches that are treated with naproxen and Claritin. She continues to have perceived right-sided weakness and sensory changes. So far her workup has been unremarkable. This could possibly be a fibromyalgia syndrome. For now the patient is able to manage her symptoms however I explained that if her symptoms got worse she should let us know. She verbalized understanding. She will follow-up in 3-4 months or sooner if needed.   Butch Penny, MSN, NP-C 08/31/2014, 9:06 AM [facility] [address], [address] [place], [place] [address] [phone]  Note: This document was prepared with digital dictation and possible smart phrase technology. Any transcriptional errors that result from this process are unintentional.

## 2014-10-12 ENCOUNTER — Ambulatory Visit: Payer: BC Managed Care – PPO | Admitting: Nurse Practitioner

## 2015-01-07 ENCOUNTER — Ambulatory Visit (INDEPENDENT_AMBULATORY_CARE_PROVIDER_SITE_OTHER): Payer: BC Managed Care – PPO | Admitting: Adult Health

## 2015-01-07 ENCOUNTER — Encounter: Payer: Self-pay | Admitting: Adult Health

## 2015-01-07 VITALS — BP 121/82 | HR 78 | Ht 63.0 in | Wt 189.0 lb

## 2015-01-07 DIAGNOSIS — R51 Headache: Secondary | ICD-10-CM

## 2015-01-07 DIAGNOSIS — R209 Unspecified disturbances of skin sensation: Secondary | ICD-10-CM | POA: Diagnosis not present

## 2015-01-07 DIAGNOSIS — R519 Headache, unspecified: Secondary | ICD-10-CM

## 2015-01-07 NOTE — Progress Notes (Signed)
I have read the note, and I agree with the clinical assessment and plan.  Kelly Fields,Kelly Fields   

## 2015-01-07 NOTE — Patient Instructions (Signed)
If headaches worsen please let us know.  We will continue to monitor sensory changes and weakness. If your symptoms worsen or you develop new symptoms please let us know.

## 2015-01-07 NOTE — Progress Notes (Signed)
PATIENT: Kelly Fields DOB: 1970-07-28  REASON FOR VISIT: follow up- migraine headaches, weakness/sensory changes HISTORY FROM: patient  HISTORY OF PRESENT ILLNESS: Kelly Fields is a 44 year old female with a history of migraine headaches and weakness/sensory changes on the right side. She returns today for an evaluation. Patient states that she her migraines have been under control. She states that in the last month she's had one severe migraine but it was controlled with Imitrex. She also tends to get sinus headaches. Patient continues to have intermittent sensory changes on the lower extremities. She states that it feels like something is crawling on her legs but nothing is there. She will occasionally have weakness in the right thigh. She states that it feels like she has ran a marathon but then resolves. She continues to have issues with word finding- this has been ongoing. Overall she feels that her symptoms have remained the same. Denies any changes with her gait or balance. Denies any falls. Patient does have severe pain in the right foot has been evaluated by Dr. Victorino Dike. She is having MRI on the foot this Saturday. Denies any new neurological symptoms. She returns today for an evaluation  HISTORY 08/31/14:Kelly Fields is a 44 year old female with a history of migraine headaches and weakness and sensory changes on the right side. She returns today for follow-up. The patient has had an MRI that showed white matter changes consistent with her migraines. The patient states that her migraine headaches have improved. She very rarely has a true migraine. However she does have sinus headaches. She states that she has at least one headache a week. She takes Claritin and naproxen and this seems to work well for her. She feels that she may have an allergy to grass. The patient states that in the last week she's had right thigh weakness. She describes it as a "Jell-O" feeling that starts in the  thigh area and radiates to the lower leg. She reports the same type of pain in the right shoulder. She describes it as if she has overworked that shoulder. She continues to have trouble with her memory. She describes it as issues with word finding. She states that occasionally she will substitute the wrong word. She denies any troubles with the bowels or bladder. Denies any changes with her vision. Denies any trouble with her gait or balance. Although she states that sometimes she feels like her balance is off. Denies any falls. Overall she feels that she has remained the same. No new medical issues. No new neurological complaints.   REVIEW OF SYSTEMS: Out of a complete 14 system review of symptoms, the patient complains only of the following symptoms, and all other reviewed systems are negative.  See HPI  ALLERGIES: No Known Allergies  HOME MEDICATIONS: Outpatient Prescriptions Prior to Visit  Medication Sig Dispense Refill  . ibuprofen (ADVIL,MOTRIN) 200 MG tablet Take 200 mg by mouth every 6 (six) hours as needed for pain.    . naproxen (NAPROSYN) 500 MG tablet Take 500 mg by mouth every 12 (twelve) hours as needed. for pain  1  . SUMAtriptan (IMITREX) 100 MG tablet Take 1 tablet (100 mg total) by mouth as needed for migraine. One for headache, no more than 3 per week 10 tablet 11   No facility-administered medications prior to visit.    PAST MEDICAL HISTORY: Past Medical History  Diagnosis Date  . Migraine   . Tremor   . Endometriosis     PAST SURGICAL  HISTORY: Past Surgical History  Procedure Laterality Date  . Abdominal hysterectomy    . Cholecystectomy    . Appendectomy      FAMILY HISTORY: Family History  Problem Relation Age of Onset  . Neuropathy Mother   . Restless legs syndrome Mother   . Transient ischemic attack Father     SOCIAL HISTORY: History   Social History  . Marital Status: Single    Spouse Name: N/A  . Number of Children: 0  . Years of  Education: College   Occupational History  . Teacher     [employer]   Social History Main Topics  . Smoking status: Never Smoker   . Smokeless tobacco: Never Used  . Alcohol Use: No  . Drug Use: No  . Sexual Activity: No   Other Topics Concern  . Not on file   Social History Narrative   Patient lives at home and her parent's live with her.    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   Right handed.   Caffeine two cups daily.      PHYSICAL EXAM  Filed Vitals:   01/07/15 0856  Height: 5\' 3"  (1.6 m)  Weight: 189 lb (85.73 kg)   Body mass index is 33.49 kg/(m^2).  Generalized: Well developed, in no acute distress   Neurological examination  Mentation: Alert oriented to time, place, history taking. Follows all commands speech and language fluent Cranial nerve II-XII: Pupils were equal round reactive to light. Extraocular movements were full, visual field were full on confrontational test. Facial sensation and strength were normal. Uvula tongue midline. Head turning and shoulder shrug  were normal and symmetric. Motor: The motor testing reveals 5 over 5 strength of all 4 extremities. Good symmetric motor tone is noted throughout.  Sensory: Sensory testing is intact to soft touch on all 4 extremities. No evidence of extinction is noted.  Coordination: Cerebellar testing reveals good finger-nose-finger and heel-to-shin bilaterally.  Gait and station: Gait is normal. Tandem gait is normal. Romberg is negative. No drift is seen.  Reflexes: Deep tendon reflexes are symmetric and normal bilaterally.   DIAGNOSTIC DATA (LABS, IMAGING, TESTING) - I reviewed patient records, labs, notes, testing and imaging myself where available.      ASSESSMENT AND PLAN 44 y.o. year old female  has a past medical history of Migraine; Tremor; and Endometriosis. here with:  1. Headache 2. Disturbance of skin sensation  Overall the patient is doing well. Her  symptoms have remained the same. She is no longer having frequent migraines. When she does have a migraine it is resolved quickly with Imitrex. She continues to have sensory changes in the lower legs that are intermittent. We will continue to monitor. Patient's MOCA is 28/30. We will continue to monitor her memory. Patient advised that if her symptoms worsen or she develops new symptoms she should let us know. Otherwise she will follow-up in 6 months or sooner if needed.  Butch PennyMegan Jaelin Fackler, MSN, NP-C 01/07/2015, 9:01 AM [facility] [address], [address] [place], [place] [id number] [phone]  Note: This document was prepared with digital dictation and possible smart phrase technology. Any transcriptional errors that result from this process are unintentional.

## 2015-07-12 ENCOUNTER — Ambulatory Visit: Payer: BC Managed Care – PPO | Admitting: Adult Health

## 2015-07-19 ENCOUNTER — Encounter: Payer: Self-pay | Admitting: Adult Health

## 2015-07-19 ENCOUNTER — Ambulatory Visit (INDEPENDENT_AMBULATORY_CARE_PROVIDER_SITE_OTHER): Payer: BC Managed Care – PPO | Admitting: Adult Health

## 2015-07-19 VITALS — BP 124/86 | HR 84 | Resp 20 | Ht 62.5 in | Wt 190.0 lb

## 2015-07-19 DIAGNOSIS — R209 Unspecified disturbances of skin sensation: Secondary | ICD-10-CM

## 2015-07-19 DIAGNOSIS — G43009 Migraine without aura, not intractable, without status migrainosus: Secondary | ICD-10-CM

## 2015-07-19 MED ORDER — SUMATRIPTAN SUCCINATE 100 MG PO TABS: 100.0000 mg | ORAL_TABLET | ORAL | Status: DC | PRN

## 2015-07-19 NOTE — Progress Notes (Signed)
I have read the note, and I agree with the clinical assessment and plan.  WILLIS,CHARLES KEITH   

## 2015-07-19 NOTE — Progress Notes (Signed)
PATIENT: Kelly Fields DOB: 10/31/1970  REASON FOR VISIT: follow up- disturbance of skin sensation, migraines HISTORY FROM: patient  HISTORY OF PRESENT ILLNESS: Kelly Fields is a 45 year old female with a history of migraine headaches and disturbance of skin sensation. She returns today for follow-up. Patient reports that last month her migraine frequency increased. She states that she had approximately 2-3 headaches last month. She contributes this to potentially the weather and her sinuses. She states that most of her headaches started as a sinus headache and turning to her migraine. She continues to use the Imitrex with good benefit. She continues to have a crawling sensation in the lower extremities that happens occasionally. She will have the same sensations across the face. These sensations are not painful just annoying. The patient denies any weakness. Denies any changes with the bowels or bladder. The patient did have surgery on her left foot. She is in a boot currently. She denies any changes with her gait or balance. She returns today for an evaluation.   HISTORY 01/07/15: Kelly Fields is a 45 year old female with a history of migraine headaches and weakness/sensory changes on the right side. She returns today for an evaluation. Patient states that she her migraines have been under control. She states that in the last month she's had one severe migraine but it was controlled with Imitrex. She also tends to get sinus headaches. Patient continues to have intermittent sensory changes on the lower extremities. She states that it feels like something is crawling on her legs but nothing is there. She will occasionally have weakness in the right thigh. She states that it feels like she has ran a marathon but then resolves. She continues to have issues with word finding- this has been ongoing. Overall she feels that her symptoms have remained the same. Denies any changes with her gait or  balance. Denies any falls. Patient does have severe pain in the right foot has been evaluated by Dr. Victorino Dike. She is having MRI on the foot this Saturday. Denies any new neurological symptoms. She returns today for an evaluation  REVIEW OF SYSTEMS: Out of a complete 14 system review of symptoms, the patient complains only of the following symptoms, and all other reviewed systems are negative.  Blurred vision  ALLERGIES: No Known Allergies  HOME MEDICATIONS: Outpatient Prescriptions Prior to Visit  Medication Sig Dispense Refill  . ibuprofen (ADVIL,MOTRIN) 200 MG tablet Take 200 mg by mouth every 6 (six) hours as needed for pain.    . naproxen (NAPROSYN) 500 MG tablet Take 500 mg by mouth every 12 (twelve) hours as needed. for pain  1  . SUMAtriptan (IMITREX) 100 MG tablet Take 1 tablet (100 mg total) by mouth as needed for migraine. One for headache, no more than 3 per week 10 tablet 11  . metoCLOPramide (REGLAN) 5 MG tablet 5 mg as needed.      No facility-administered medications prior to visit.    PAST MEDICAL HISTORY: Past Medical History  Diagnosis Date  . Migraine   . Tremor   . Endometriosis     PAST SURGICAL HISTORY: Past Surgical History  Procedure Laterality Date  . Abdominal hysterectomy    . Cholecystectomy    . Appendectomy      FAMILY HISTORY: Family History  Problem Relation Age of Onset  . Neuropathy Mother   . Restless legs syndrome Mother   . Transient ischemic attack Father     SOCIAL HISTORY: Social History  Social History  . Marital Status: Single    Spouse Name: N/A  . Number of Children: 0  . Years of Education: College   Occupational History  . Teacher     [employer]   Social History Main Topics  . Smoking status: Never Smoker   . Smokeless tobacco: Never Used  . Alcohol Use: No  . Drug Use: No  . Sexual Activity: No   Other Topics Concern  . Not on file   Social History Narrative   Patient lives at home and her  parent's live with her.    Patient works full time Optometrist.   Education Psychologist, counselling.   Right handed.   Caffeine two cups daily.      PHYSICAL EXAM  Filed Vitals:   07/19/15 0747  BP: 124/86  Pulse: 84  Resp: 20  Height: 5' 2.5" (1.588 m)  Weight: 190 lb (86.183 kg)   Body mass index is 34.18 kg/(m^2).  Generalized: Well developed, in no acute distress   Neurological examination  Mentation: Alert oriented to time, place, history taking. Follows all commands speech and language fluent Cranial nerve II-XII: Pupils were equal round reactive to light. Extraocular movements were full, visual field were full on confrontational test. Facial sensation and strength were normal. Uvula tongue midline. Head turning and shoulder shrug  were normal and symmetric. Motor: The motor testing reveals 5 over 5 strength of all 4 extremities. Good symmetric motor tone is noted throughout.  Sensory: Sensory testing is intact to soft touch on all 4 extremities. No evidence of extinction is noted.  Coordination: Cerebellar testing reveals good finger-nose-finger and heel-to-shin bilaterally.  Gait and station: Gait is normal. Tandem gait is normal. Romberg is negative. No drift is seen.  Reflexes: Deep tendon reflexes are symmetric and normal bilaterally.   DIAGNOSTIC DATA (LABS, IMAGING, TESTING) - I reviewed patient records, labs, notes, testing and imaging myself where available.  MRI brain: IMPRESSION:  Mildly abnormal MRI brain (with and without) demonstrating: 1. There are 4 small, punctate foci of non-specific gliosis in the periventricular and subcortical white matter. None of these lesions have abnormal enhancement. These findings are non-specific and considerations include autoimmune, inflammatory, post-infectious, microvascular ischemic or migraine associated etiologies.  2. Separately, there is a different small focus (6mm) of enhancement in the right parasagittal, frontal  juxtacortical region seen on coronal T1 post views (series 12 image 15), but not seen on axial T1 views or T2FLAIR views (axial nor sagittal). 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  ASSESSMENT AND PLAN 45 y.o. year old female  has a past medical history of Migraine; Tremor; and Endometriosis. here with:  1. Migraine headaches 2. Disturbance of skin sensation  Overall the patient is doing well. She will continue using Imitrex for her headaches. I advised the patient that if her headache frequency continues to increase she should let us know. Also advised patient that if she develops any new symptoms she should make [place] aware. In the future we may consider repeating her MRI of the brain. Patient will follow-up in 6 months with Dr. Anne Hahn.     Butch Penny, MSN, NP-C 07/19/2015, 7:55 AM [facility] [address] [place], [place] [address] [phone]

## 2015-07-19 NOTE — Patient Instructions (Signed)
Continue Imitrex- refilled today If your symptoms worsen or you develop new symptoms please let us know.

## 2015-08-24 ENCOUNTER — Other Ambulatory Visit: Payer: Self-pay | Admitting: Obstetrics and Gynecology

## 2015-08-24 DIAGNOSIS — R928 Other abnormal and inconclusive findings on diagnostic imaging of breast: Secondary | ICD-10-CM

## 2015-08-30 ENCOUNTER — Ambulatory Visit
Admission: RE | Admit: 2015-08-30 | Discharge: 2015-08-30 | Disposition: A | Discharge status: Home / Self Care | Payer: BC Managed Care – PPO | Source: Ambulatory Visit | Attending: Obstetrics and Gynecology | Admitting: Obstetrics and Gynecology

## 2015-08-30 DIAGNOSIS — R928 Other abnormal and inconclusive findings on diagnostic imaging of breast: Secondary | ICD-10-CM

## 2015-10-21 ENCOUNTER — Telehealth: Payer: Self-pay | Admitting: Adult Health

## 2015-10-21 NOTE — Telephone Encounter (Signed)
Patient is calling. She states for the last couple of days she is having much weakness on her right side and her right leg just feels tired. She says her memory seems fuzzy as well. Please call to discuss.

## 2015-10-21 NOTE — Telephone Encounter (Signed)
I spoke to pt. She stated that the sx are worsening , R leg weakness, burning sensation.  Memory fuzziness.   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

## 2015-10-25 ENCOUNTER — Ambulatory Visit (INDEPENDENT_AMBULATORY_CARE_PROVIDER_SITE_OTHER): Payer: BC Managed Care – PPO | Admitting: Adult Health

## 2015-10-25 ENCOUNTER — Encounter: Payer: Self-pay | Admitting: Adult Health

## 2015-10-25 VITALS — BP 122/83 | HR 87 | Resp 16 | Ht 62.5 in | Wt 196.0 lb

## 2015-10-25 DIAGNOSIS — R51 Headache: Secondary | ICD-10-CM | POA: Diagnosis not present

## 2015-10-25 DIAGNOSIS — R29898 Other symptoms and signs involving the musculoskeletal system: Secondary | ICD-10-CM | POA: Diagnosis not present

## 2015-10-25 DIAGNOSIS — R519 Headache, unspecified: Secondary | ICD-10-CM

## 2015-10-25 DIAGNOSIS — R930 Abnormal findings on diagnostic imaging of skull and head, not elsewhere classified: Secondary | ICD-10-CM

## 2015-10-25 NOTE — Progress Notes (Signed)
PATIENT: Kelly Fields DOB: December 26, 1970  REASON FOR VISIT: follow up- headaches, disturbance of skin sensation HISTORY FROM: patient  HISTORY OF PRESENT ILLNESS:  Today 10/25/2015: Kelly Fields is a 45 year old female with a history of migraine headaches and disturbance of skin sensation. She returns today complaining of right leg weakness. She reports that this started about a week and a half ago. 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 She states that the right leg feels weak and shaky. She describes it as feeling like "Jell-O." She denies any sharp shooting pains down the leg. She denies any discomfort in the right leg. Denies any changes with the bowels or bladder. Denies any numbness. She states occasionally she'll have a burning sensation on the lateral side of the right lower extremity. She does have a history of sensory changes. She denies any significant back pain. She states occasionally she will feel as if she is having "tightening of the middle back." She states that she does have some lower back pain but this is not new. The patient reports that occasionally she has a hard time focusing on objects. She states that her headaches are infrequent. When she does have a headache she uses Imitrex. She returns today for an evaluation.  MRI of the brain 06/08/2014:  1. There are 4 small, punctate foci of non-specific gliosis in the periventricular and subcortical white matter. None of these lesions have abnormal enhancement. These findings are non-specific and considerations include autoimmune, inflammatory, post-infectious, microvascular ischemic or migraine associated etiologies.  2. Separately, there is a different small focus (6mm) of enhancement in the right parasagittal, frontal juxtacortical region seen on coronal T1 post views (series 12 image  15), but not seen on axial T1 views or T2FLAIR views (axial nor sagittal). 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   HISTORY 07/19/15 (MM): Kelly Fields is a 45 year old female with a history of migraine headaches and disturbance of skin sensation. She returns today for follow-up. Patient reports that last month her migraine frequency increased. She states that she had approximately 2-3 headaches last month. She contributes this to potentially the weather and her sinuses. She states that most of her headaches started as a sinus headache and turning to her migraine. She continues to use the Imitrex with good benefit. She continues to have a crawling sensation in the lower extremities that happens occasionally. She will have the same sensations across the face. These sensations are not painful just annoying. The patient denies any weakness. Denies any changes with the bowels or bladder. The patient did have surgery on her left foot. She is in a boot currently. She denies any changes with her gait or balance. She returns today for an evaluation.   HISTORY 01/07/15: Kelly Fields is a 45 year old female with a history of migraine headaches and weakness/sensory changes on the right side. She returns today for an evaluation. Patient states that she her migraines have been under control. She states that in the last month she's had one severe migraine but it was controlled with Imitrex. She also tends to get sinus headaches. Patient continues to have intermittent sensory changes on the lower extremities. She states that it feels like something is crawling on her legs but nothing is there. She will occasionally have weakness in the right thigh. She  states that it feels like she has ran a marathon but then resolves. She continues to have issues with word finding- this has been ongoing. Overall she feels that her symptoms have remained the same. Denies any changes with her gait or balance. Denies any falls. Patient  does have severe pain in the right foot has been evaluated by Dr. Victorino Dike. She is having MRI on the foot this Saturday. Denies any new neurological symptoms. She returns today for an evaluation  REVIEW OF SYSTEMS: Out of a complete 14 system review of symptoms, the patient complains only of the following symptoms, and all other reviewed systems are negative.  Fatigue, blurred vision, muscle cramps, headache, tremors  ALLERGIES: No Known Allergies  HOME MEDICATIONS: Outpatient Prescriptions Prior to Visit  Medication Sig Dispense Refill  . ibuprofen (ADVIL,MOTRIN) 200 MG tablet Take 200 mg by mouth every 6 (six) hours as needed for pain.    . naproxen (NAPROSYN) 500 MG tablet Take 500 mg by mouth every 12 (twelve) hours as needed. for pain  1  . SUMAtriptan (IMITREX) 100 MG tablet Take 1 tablet (100 mg total) by mouth as needed for migraine. One for headache, no more than 3 per week 10 tablet 11   No facility-administered medications prior to visit.    PAST MEDICAL HISTORY: Past Medical History  Diagnosis Date  . Migraine   . Tremor   . Endometriosis     PAST SURGICAL HISTORY: Past Surgical History  Procedure Laterality Date  . Abdominal hysterectomy    . Cholecystectomy    . Appendectomy      FAMILY HISTORY: Family History  Problem Relation Age of Onset  . Neuropathy Mother   . Restless legs syndrome Mother   . Transient ischemic attack Father     SOCIAL HISTORY: Social History   Social History  . Marital Status: Single    Spouse Name: N/A  . Number of Children: 0  . Years of Education: College   Occupational History  . Teacher     [employer]   Social History Main Topics  . Smoking status: Never Smoker   . Smokeless tobacco: Never Used  . Alcohol Use: No  . Drug Use: No  . Sexual Activity: No   Other Topics Concern  . Not on file   Social History Narrative   Patient lives at home and her parent's live with her.    Patient works full time Furniture conservator/restorer.   Education Psychologist, counselling.   Right handed.   Caffeine two cups daily.      PHYSICAL EXAM  Filed Vitals:   10/25/15 0739  BP: 122/83  Pulse: 87  Resp: 16  Height: 5' 2.5" (1.588 m)  Weight: 196 lb (88.905 kg)   Body mass index is 35.26 kg/(m^2).  Generalized: Well developed, in no acute distress   Neurological examination  Mentation: Alert oriented to time, place, history taking. Follows all commands speech and language fluent Cranial nerve II-XII: Pupils were equal round reactive to light. Extraocular movements were full, visual field were full on confrontational test. Facial sensation and strength were normal. Uvula tongue midline. Head turning and shoulder shrug  were normal and symmetric. Motor: The motor testing reveals 5 over 5 strength of all 4 extremities. Good symmetric motor tone is noted throughout.  Sensory: Sensory testing is intact to soft touch on all 4 extremities. No evidence of extinction is noted.  Coordination: Cerebellar testing reveals good finger-nose-finger and heel-to-shin bilaterally.  Gait  and station: Gait is normal.Tiptoe walking and heel walking is normal. Tandem gait is normal. Romberg is negative. No drift is seen.  Reflexes: Deep tendon reflexes are symmetric and normal bilaterally.   DIAGNOSTIC DATA (LABS, IMAGING, TESTING) - I reviewed patient records, labs, notes, testing and imaging myself where available.       ASSESSMENT AND PLAN 45 y.o. year old female  has a past medical history of Migraine; Tremor; and Endometriosis. here with:  1. Right leg weakness 2. Disturbance of skin sensation 3. headaches   The patient's physical exam is unremarkable. The patient has good strength in all extremities. In the past she has had an abnormal MRI of the brain. We will repeat this to look for any acute changes. The patient's headaches have remained under good control. Patient advised that if her symptoms worsen or she develops any new  symptoms she should let us know. The patient will follow-up in July with Dr. Anne HahnWillis.     Butch PennyMegan Sophi Calligan, MSN, NP-C 10/25/2015, 10:29 AM [facility] [address], [address] [place], [place] [id number] [phone]

## 2015-10-25 NOTE — Patient Instructions (Signed)
We will repeat MRI If your symptoms worsen or you develop new symptoms please let us know.  Keep appt. In July with Dr. Anne Hahnwillis

## 2015-10-25 NOTE — Progress Notes (Signed)
I have read the note, and I agree with the clinical assessment and plan.  WILLIS,CHARLES KEITH   

## 2015-10-27 ENCOUNTER — Encounter: Payer: Self-pay | Admitting: Adult Health

## 2015-11-03 ENCOUNTER — Ambulatory Visit (INDEPENDENT_AMBULATORY_CARE_PROVIDER_SITE_OTHER): Payer: BC Managed Care – PPO

## 2015-11-03 DIAGNOSIS — R930 Abnormal findings on diagnostic imaging of skull and head, not elsewhere classified: Secondary | ICD-10-CM | POA: Diagnosis not present

## 2015-11-03 MED ORDER — GADOPENTETATE DIMEGLUMINE 469.01 MG/ML IV SOLN: 19.0000 mL | Freq: Once | INTRAVENOUS | Status: AC | PRN

## 2015-11-08 ENCOUNTER — Telehealth: Payer: Self-pay | Admitting: Adult Health

## 2015-11-08 NOTE — Telephone Encounter (Signed)
I called the patient. There were no significant changes to MRI. She has f/u in July with Dr. Anne HahnWillis

## 2015-11-09 ENCOUNTER — Encounter: Payer: Self-pay | Admitting: Adult Health

## 2015-11-09 NOTE — Telephone Encounter (Signed)
Please call the patient and schedule an appointment for next week. I have responded via mychart and let the patient know we would be calling to schedule an appointment.

## 2015-11-10 ENCOUNTER — Telehealth: Payer: Self-pay

## 2015-11-10 NOTE — Telephone Encounter (Signed)
Rn call patient because she sent Aundra MilletMegan a 4 page mychart concerns and questions. Per Megan(NP) request pt was schedule for appt on May 24 to discuss her concerns and questions.

## 2015-11-17 ENCOUNTER — Encounter: Payer: Self-pay | Admitting: Adult Health

## 2015-11-17 ENCOUNTER — Ambulatory Visit (INDEPENDENT_AMBULATORY_CARE_PROVIDER_SITE_OTHER): Payer: BC Managed Care – PPO | Admitting: Adult Health

## 2015-11-17 VITALS — BP 135/89 | HR 86 | Resp 20 | Ht 62.5 in | Wt 192.0 lb

## 2015-11-17 DIAGNOSIS — R209 Unspecified disturbances of skin sensation: Secondary | ICD-10-CM

## 2015-11-17 DIAGNOSIS — R51 Headache: Secondary | ICD-10-CM

## 2015-11-17 DIAGNOSIS — R202 Paresthesia of skin: Secondary | ICD-10-CM

## 2015-11-17 DIAGNOSIS — G729 Myopathy, unspecified: Secondary | ICD-10-CM

## 2015-11-17 DIAGNOSIS — R29898 Other symptoms and signs involving the musculoskeletal system: Secondary | ICD-10-CM | POA: Diagnosis not present

## 2015-11-17 DIAGNOSIS — R519 Headache, unspecified: Secondary | ICD-10-CM

## 2015-11-17 DIAGNOSIS — M6289 Other specified disorders of muscle: Secondary | ICD-10-CM

## 2015-11-17 MED ORDER — DULOXETINE HCL 30 MG PO CPEP
30.0000 mg | ORAL_CAPSULE | Freq: Every day | ORAL | Status: DC
Start: 2015-11-17 — End: 2016-01-20

## 2015-11-17 NOTE — Patient Instructions (Signed)
Blood work today MRI cervical spine Begin Cymbalta 30 mg daily  Duloxetine delayed-release capsules What is this medicine? DULOXETINE (doo LOX e teen) is used to treat depression, anxiety, and different types of chronic pain. This medicine may be used for other purposes; ask your health care provider or pharmacist if you have questions. What should I tell my health care provider before I take this medicine? They need to know if you have any of these conditions: -bipolar disorder or a family history of bipolar disorder -glaucoma -kidney disease -liver disease -suicidal thoughts or a previous suicide attempt -taken medicines called MAOIs like Carbex, Eldepryl, Marplan, Nardil, and Parnate within 14 days -an unusual reaction to duloxetine, other medicines, foods, dyes, or preservatives -pregnant or trying to get pregnant -breast-feeding How should I use this medicine? Take this medicine by mouth with a glass of water. Follow the directions on the prescription label. Do not cut, crush or chew this medicine. You can take this medicine with or without food. Take your medicine at regular intervals. Do not take your medicine more often than directed. Do not stop taking this medicine suddenly except upon the advice of your doctor. Stopping this medicine too quickly may cause serious side effects or your condition may worsen. A special MedGuide will be given to you by the pharmacist with each prescription and refill. Be sure to read this information carefully each time. Talk to your pediatrician regarding the use of this medicine in children. While this drug may be prescribed for children as young as 607 years of age for selected conditions, precautions do apply. Overdosage: If you think you have taken too much of this medicine contact a poison control center or emergency room at once. NOTE: This medicine is only for you. Do not share this medicine with others. What if I miss a dose? If you miss a  dose, take it as soon as you can. If it is almost time for your next dose, take only that dose. Do not take double or extra doses. What may interact with this medicine? Do not take this medicine with any of the following medications: -certain diet drugs like dexfenfluramine, fenfluramine -desvenlafaxine -linezolid -MAOIs like Azilect, Carbex, Eldepryl, Marplan, Nardil, and Parnate -methylene blue (intravenous) -milnacipran -thioridazine -venlafaxine This medicine may also interact with the following medications: -alcohol -aspirin and aspirin-like medicines -certain antibiotics like ciprofloxacin and enoxacin -certain medicines for blood pressure, heart disease, irregular heart beat -certain medicines for depression, anxiety, or psychotic disturbances -certain medicines for migraine headache like almotriptan, eletriptan, frovatriptan, naratriptan, rizatriptan, sumatriptan, zolmitriptan -certain medicines that treat or prevent blood clots like warfarin, enoxaparin, and dalteparin -cimetidine -fentanyl -lithium -NSAIDS, medicines for pain and inflammation, like ibuprofen or naproxen -phentermine -procarbazine -sibutramine -St. John's wort -theophylline -tramadol -tryptophan This list may not describe all possible interactions. Give your health care provider a list of all the medicines, herbs, non-prescription drugs, or dietary supplements you use. Also tell them if you smoke, drink alcohol, or use illegal drugs. Some items may interact with your medicine. What should I watch for while using this medicine? Tell your doctor if your symptoms do not get better or if they get worse. Visit your doctor or health care professional for regular checks on your progress. Because it may take several weeks to see the full effects of this medicine, it is important to continue your treatment as prescribed by your doctor. Patients and their families should watch out for new or worsening thoughts of  suicide or  depression. Also watch out for sudden changes in feelings such as feeling anxious, agitated, panicky, irritable, hostile, aggressive, impulsive, severely restless, overly excited and hyperactive, or not being able to sleep. If this happens, especially at the beginning of treatment or after a change in dose, call your health care professional. Bonita Quin may get drowsy or dizzy. Do not drive, use machinery, or do anything that needs mental alertness until you know how this medicine affects you. Do not stand or sit up quickly, especially if you are an older patient. This reduces the risk of dizzy or fainting spells. Alcohol may interfere with the effect of this medicine. Avoid alcoholic drinks. This medicine can cause an increase in blood pressure. This medicine can also cause a sudden drop in your blood pressure, which may make you feel faint and increase the chance of a fall. These effects are most common when you first start the medicine or when the dose is increased, or during use of other medicines that can cause a sudden drop in blood pressure. Check with your doctor for instructions on monitoring your blood pressure while taking this medicine. Your mouth may get dry. Chewing sugarless gum or sucking hard candy, and drinking plenty of water may help. Contact your doctor if the problem does not go away or is severe. What side effects may I notice from receiving this medicine? Side effects that you should report to your doctor or health care professional as soon as possible: -allergic reactions like skin rash, itching or hives, swelling of the face, lips, or tongue -changes in blood pressure -confusion -dark urine -dizziness -fast talking and excited feelings or actions that are out of control -fast, irregular heartbeat -fever -general ill feeling or flu-like symptoms -hallucination, loss of contact with reality -light-colored stools -loss of balance or coordination -redness, blistering,  peeling or loosening of the skin, including inside the mouth -right upper belly pain -seizures -suicidal thoughts or other mood changes -trouble concentrating -trouble passing urine or change in the amount of urine -unusual bleeding or bruising -unusually weak or tired -yellowing of the eyes or skin Side effects that usually do not require medical attention (report to your doctor or health care professional if they continue or are bothersome): -blurred vision -change in appetite -change in sex drive or performance -headache -increased sweating -nausea This list may not describe all possible side effects. Call your doctor for medical advice about side effects. You may report side effects to FDA at 1-800-FDA-1088. Where should I keep my medicine? Keep out of the reach of children. Store at room temperature between 20 and 25 degrees C (68 to 77 degrees F). Throw away any unused medicine after the expiration date. NOTE: This sheet is a summary. It may not cover all possible information. If you have questions about this medicine, talk to your doctor, pharmacist, or health care provider.    2016, Elsevier/Gold Standard. (2013-06-03 16:10:96)

## 2015-11-17 NOTE — Progress Notes (Signed)
PATIENT: Kelly Fields DOB: [birth year]  REASON FOR VISIT: follow up HISTORY FROM: patient  HISTORY OF PRESENT ILLNESS: Kelly Fields is a 45 year old female with a history of migraine headaches and disturbance of skin sensation. She returns today complaining of weakness in the right upper and lower extremity. 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 Her mom states that when she comes in in the evenings she appears exhausted. She denies any changes with her gait or balance. She denies any falls. She states that she did bend down to help a child and had a hard time standing. She states that she continues to have a "crawling sensation" primarily on the left upper and lower extremity. She states that she does have a "hot poker" type pain that radiates down the right arm but only lasts for seconds. She denies any changes with her bowels or bladder. Denies any changes with the vision. She has not started any new medication. She does feel that she has some muscle stiffness primarily in the right leg and lower back. She returns today for an evaluation.  HISTORY 10/25/15:  Kelly. Fields is a 45 year old female with a history of migraine headaches and disturbance of skin sensation. She returns today complaining of right leg weakness. She reports that this started about a week and a half ago. 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 She states that the right leg feels weak and shaky. She describes it as feeling like "Jell-O." She denies any sharp shooting pains down the leg. She denies any discomfort in the right leg. Denies any changes with the bowels or bladder. Denies any numbness. She states occasionally she'll have a burning sensation on the lateral side of the right lower  extremity. She does have a history of sensory changes. She denies any significant back pain. She states occasionally she will feel as if she is having "tightening of the middle back." She states that she does have some lower back pain but this is not new. The patient reports that occasionally she has a hard time focusing on objects. She states that her headaches are infrequent. When she does have a headache she uses Imitrex. She returns today for an evaluation.  MRI of the brain 06/08/2014:  1. There are 4 small, punctate foci of non-specific gliosis in the periventricular and subcortical white matter. None of these lesions have abnormal enhancement. These findings are non-specific and considerations include autoimmune, inflammatory, post-infectious, microvascular ischemic or migraine associated etiologies.  2. Separately, there is a different small focus (6mm) of enhancement in the right parasagittal, frontal juxtacortical region seen on coronal T1 post views (series 12 image 15), but not seen on axial T1 views or T2FLAIR views (axial nor sagittal). 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   HISTORY 07/19/15 (MM): Kelly. Fields is a 45 year old female with a history of migraine headaches and disturbance of skin sensation. She returns today for follow-up. Patient reports that last month her migraine frequency increased. She states that she had approximately 2-3 headaches last month. She contributes this to potentially the weather and her sinuses. She states that most of her headaches started as a sinus headache and turning to her migraine. She continues to  use the Imitrex with good benefit. She continues to have a crawling sensation in the lower extremities that happens occasionally. She will have the same sensations across the face. These sensations are not painful just annoying. The patient denies any weakness. Denies any changes with the bowels or bladder. The patient did have surgery on her left  foot. She is in a boot currently. She denies any changes with her gait or balance. She returns today for an evaluation.   HISTORY 01/07/15: Kelly. Fields is a 45 year old female with a history of migraine headaches and weakness/sensory changes on the right side. She returns today for an evaluation. Patient states that she her migraines have been under control. She states that in the last month she's had one severe migraine but it was controlled with Imitrex. She also tends to get sinus headaches. Patient continues to have intermittent sensory changes on the lower extremities. She states that it feels like something is crawling on her legs but nothing is there. She will occasionally have weakness in the right thigh. She states that it feels like she has ran a marathon but then resolves. She continues to have issues with word finding- this has been ongoing. Overall she feels that her symptoms have remained the same. Denies any changes with her gait or balance. Denies any falls. Patient does have severe pain in the right foot has been evaluated by Dr. Victorino Dike. She is having MRI on the foot this Saturday. Denies any new neurological symptoms. She returns today for an evaluation  REVIEW OF SYSTEMS: Out of a complete 14 system review of symptoms, the patient complains only of the following symptoms, and all other reviewed systems are negative.  Fatigue, blurred vision, aching muscles, muscle cramps, headache  ALLERGIES: No Known Allergies  HOME MEDICATIONS: Outpatient Prescriptions Prior to Visit  Medication Sig Dispense Refill  . Estrogens Conjugated (PREMARIN PO) Take by mouth.    Marland Kitchen ibuprofen (ADVIL,MOTRIN) 200 MG tablet Take 200 mg by mouth every 6 (six) hours as needed for pain.    . naproxen (NAPROSYN) 500 MG tablet Take 500 mg by mouth every 12 (twelve) hours as needed. for pain  1  . SUMAtriptan (IMITREX) 100 MG tablet Take 1 tablet (100 mg total) by mouth as needed for migraine. One for headache, no  more than 3 per week 10 tablet 11   Facility-Administered Medications Prior to Visit  Medication Dose Route Frequency Provider Last Rate Last Dose  . gadopentetate dimeglumine (MAGNEVIST) injection 19 mL  19 mL Intravenous Once PRN York Spaniel, MD        PAST MEDICAL HISTORY: Past Medical History  Diagnosis Date  . Migraine   . Tremor   . Endometriosis     PAST SURGICAL HISTORY: Past Surgical History  Procedure Laterality Date  . Abdominal hysterectomy    . Cholecystectomy    . Appendectomy      FAMILY HISTORY: Family History  Problem Relation Age of Onset  . Neuropathy Mother   . Restless legs syndrome Mother   . Transient ischemic attack Father     SOCIAL HISTORY: Social History   Social History  . Marital Status: Single    Spouse Name: N/A  . Number of Children: 0  . Years of Education: College   Occupational History  . Teacher     [employer]   Social History Main Topics  . Smoking status: Never Smoker   . Smokeless tobacco: Never Used  . Alcohol Use: No  .  Drug Use: No  . Sexual Activity: No   Other Topics Concern  . Not on file   Social History Narrative   Patient lives at home and her parent's live with her.    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   Right handed.   Caffeine two cups daily.      PHYSICAL EXAM  Filed Vitals:   11/17/15 0737  BP: 135/89  Pulse: 86  Resp: 20  Height: 5' 2.5" (1.588 m)  Weight: 192 lb (87.091 kg)   Body mass index is 34.54 kg/(m^2).  Generalized: Well developed, in no acute distress  SKIN: purpura noted on the arms above the elbow bilaterally. Patient reports family history- grandfather has same marks.  Neurological examination  Mentation: Alert oriented to time, place, history taking. Follows all commands speech and language fluent Cranial nerve II-XII: Pupils were equal round reactive to light. Extraocular movements were full, visual field were full on  confrontational test. Facial sensation and strength were normal. Uvula tongue midline. Head turning and shoulder shrug  were normal and symmetric. Motor: The motor testing reveals 5 over 5 strength of all 4 extremities.Possible giveaway weakness in the right arm. No fasciculations noted in the extremities. Good symmetric motor tone is noted throughout.  Sensory: Sensory testing is intact to soft touch on all 4 extremities. No evidence of extinction is noted.  Coordination: Cerebellar testing reveals good finger-nose-finger and heel-to-shin bilaterally.  Gait and station: Gait is normal. Toe and heel walking is normal.Tandem gait is normal. Romberg is negative. No drift is seen.  Reflexes: Deep tendon reflexes are symmetric and normal bilaterally.   DIAGNOSTIC DATA (LABS, IMAGING, TESTING) - I reviewed patient records, labs, notes, testing and imaging myself where available.    ASSESSMENT AND PLAN 45 y.o. year old female  has a past medical history of Migraine; Tremor; and Endometriosis. here with:  1. Paresthesias of right arm 2. Transient right leg weakness 3. Muscle stiffness 4. Headache 5. Weakness of the right arm  The patient's physical exam is relatively unremarkable. There might be some slight weakness possible giveaway weakness in the right upper extremity. I will check blood work today- repeating some previous blood work. I will also check an MRI of the cervical spine. The patient will try Cymbalta 30 mg daily to see if it offers any relief of her symptoms. Patient advised that if her symptoms worsen or she develops any new symptoms she should let us know. She will keep her follow-up appointment in July with Dr. Anne HahnWillis.     Butch PennyMegan Darry Kelnhofer, MSN, NP-C 11/17/2015, 7:44 AM [facility] [address], [address] [place], [place] [address] [phone]

## 2015-11-17 NOTE — Progress Notes (Signed)
I have read the note, and I agree with the clinical assessment and plan.  Kelly Fields   

## 2015-11-18 ENCOUNTER — Telehealth: Payer: Self-pay | Admitting: *Deleted

## 2015-11-18 LAB — COMPREHENSIVE METABOLIC PANEL
A/G RATIO: 1.3 (ref 1.2–2.2)
ALT: 11 IU/L (ref 0–32)
AST: 12 IU/L (ref 0–40)
Albumin: 3.8 g/dL (ref 3.5–5.5)
Alkaline Phosphatase: 99 IU/L (ref 39–117)
BILIRUBIN TOTAL: 0.3 mg/dL (ref 0.0–1.2)
BUN/Creatinine Ratio: 14 (ref 9–23)
BUN: 9 mg/dL (ref 6–24)
CHLORIDE: 99 mmol/L (ref 96–106)
CO2: 24 mmol/L (ref 18–29)
Calcium: 9 mg/dL (ref 8.7–10.2)
Creatinine, Ser: 0.66 mg/dL (ref 0.57–1.00)
GFR calc non Af Amer: 106 mL/min/{1.73_m2} (ref >59)
GFR, EST AFRICAN AMERICAN: 123 mL/min/{1.73_m2} (ref >59)
Globulin, Total: 2.9 g/dL (ref 1.5–4.5)
Glucose: 82 mg/dL (ref 65–99)
POTASSIUM: 4.3 mmol/L (ref 3.5–5.2)
SODIUM: 141 mmol/L (ref 134–144)
TOTAL PROTEIN: 6.7 g/dL (ref 6.0–8.5)

## 2015-11-18 LAB — CBC WITH DIFFERENTIAL/PLATELET
BASOS ABS: 0 10*3/uL (ref 0.0–0.2)
Basos: 1 %
EOS (ABSOLUTE): 0.3 10*3/uL (ref 0.0–0.4)
Eos: 5 %
Hematocrit: 39.4 % (ref 34.0–46.6)
Hemoglobin: 13.1 g/dL (ref 11.1–15.9)
IMMATURE GRANS (ABS): 0 10*3/uL (ref 0.0–0.1)
Immature Granulocytes: 1 %
LYMPHS ABS: 1.5 10*3/uL (ref 0.7–3.1)
LYMPHS: 27 %
MCH: 29.8 pg (ref 26.6–33.0)
MCHC: 33.2 g/dL (ref 31.5–35.7)
MCV: 90 fL (ref 79–97)
MONOS ABS: 0.3 10*3/uL (ref 0.1–0.9)
Monocytes: 6 %
NEUTROS ABS: 3.4 10*3/uL (ref 1.4–7.0)
Neutrophils: 60 %
PLATELETS: 375 10*3/uL (ref 150–379)
RBC: 4.39 x10E6/uL (ref 3.77–5.28)
RDW: 13 % (ref 12.3–15.4)
WBC: 5.6 10*3/uL (ref 3.4–10.8)

## 2015-11-18 LAB — CK: CK TOTAL: 63 U/L (ref 24–173)

## 2015-11-18 LAB — RHEUMATOID FACTOR

## 2015-11-18 LAB — SEDIMENTATION RATE: Sed Rate: 6 mm/hr (ref 0–32)

## 2015-11-18 LAB — ANA: Anti Nuclear Antibody(ANA): NEGATIVE

## 2015-11-18 NOTE — Telephone Encounter (Signed)
Per Dolores HooseM Millikan, NP spoke with patient and informed her that her lab results are normal. She verbalized understanding, appreciation.

## 2015-11-19 ENCOUNTER — Telehealth: Payer: Self-pay | Admitting: Neurology

## 2015-11-19 NOTE — Telephone Encounter (Signed)
Patient called to schedule MRI, was advised, Duwayne HeckDanielle is out of the office today and office is closed Monday, will wait for Danille's call on Tuesday 11/23/15.

## 2015-12-01 ENCOUNTER — Ambulatory Visit (INDEPENDENT_AMBULATORY_CARE_PROVIDER_SITE_OTHER): Payer: BC Managed Care – PPO

## 2015-12-01 DIAGNOSIS — R202 Paresthesia of skin: Secondary | ICD-10-CM

## 2015-12-01 DIAGNOSIS — R29898 Other symptoms and signs involving the musculoskeletal system: Secondary | ICD-10-CM

## 2015-12-06 ENCOUNTER — Telehealth: Payer: Self-pay | Admitting: *Deleted

## 2015-12-06 NOTE — Telephone Encounter (Signed)
Spoke to patient she is aware of results

## 2015-12-06 NOTE — Telephone Encounter (Signed)
-----   Message from Butch PennyMegan Millikan, NP sent at 12/06/2015 10:48 AM EDT ----- No definite change when compared to previous MRI

## 2015-12-06 NOTE — Telephone Encounter (Signed)
Left message for a return call

## 2016-01-20 ENCOUNTER — Encounter: Payer: Self-pay | Admitting: Neurology

## 2016-01-20 ENCOUNTER — Ambulatory Visit (INDEPENDENT_AMBULATORY_CARE_PROVIDER_SITE_OTHER): Payer: BC Managed Care – PPO | Admitting: Neurology

## 2016-01-20 VITALS — BP 131/89 | HR 84 | Ht 62.5 in | Wt 196.8 lb

## 2016-01-20 DIAGNOSIS — R5382 Chronic fatigue, unspecified: Secondary | ICD-10-CM

## 2016-01-20 DIAGNOSIS — M797 Fibromyalgia: Secondary | ICD-10-CM | POA: Diagnosis not present

## 2016-01-20 DIAGNOSIS — G43009 Migraine without aura, not intractable, without status migrainosus: Secondary | ICD-10-CM | POA: Diagnosis not present

## 2016-01-20 MED ORDER — DULOXETINE HCL 60 MG PO CPEP: 60.0000 mg | ORAL_CAPSULE | Freq: Every day | ORAL | 5 refills | Status: DC

## 2016-01-20 NOTE — Progress Notes (Signed)
Reason for visit: Fatigue  Kelly Fields is an 45 y.o. female  History of present illness:  Kelly Fields is a 45 year old right-handed white female with a history of chronic fatigue issues. The patient indicates that she fatigues more with the right arm and right leg than the left side. The patient reports neuromuscular discomfort that involves the neck, some with the low back, but mainly with the legs, primarily in the calf muscles. The patient may have cramping that occurs usually during the daytime when she is active, not at nighttime. The patient has had a workup that has included MRI of the brain, and MRI of the cervical spine. These studies were relatively unremarkable. Blood work has been done, again unremarkable. The patient has a sense of generalized fatigue that is worse as the day goes on, she feels rested in the morning. She does have migraine headaches, these are occurring only once or twice a month, and are well controlled with the Imitrex. The patient has been placed on Cymbalta in low dose, she believes that this has been helpful, at least initially. She reports some difficulty with concentration with a "brain fog". She returns back to this office for an evaluation.  Past Medical History:  Diagnosis Date  . Endometriosis   . Migraine   . Tremor     Past Surgical History:  Procedure Laterality Date  . ABDOMINAL HYSTERECTOMY    . APPENDECTOMY    . CHOLECYSTECTOMY      Family History  Problem Relation Age of Onset  . Neuropathy Mother   . Restless legs syndrome Mother   . Transient ischemic attack Father     Social history:  reports that she has never smoked. She has never used smokeless tobacco. She reports that she does not drink alcohol or use drugs.   No Known Allergies  Medications:  Prior to Admission medications   Medication Sig Start Date End Date Taking? Authorizing Provider  DULoxetine (CYMBALTA) 30 MG capsule Take 1 capsule (30 mg total) by  mouth daily. 11/17/15   Butch Penny, NP  Estrogens Conjugated (PREMARIN PO) Take by mouth.    Historical Provider, MD  ibuprofen (ADVIL,MOTRIN) 200 MG tablet Take 200 mg by mouth every 6 (six) hours as needed for pain.    Historical Provider, MD  naproxen (NAPROSYN) 500 MG tablet Take 500 mg by mouth every 12 (twelve) hours as needed. for pain 04/21/14   Historical Provider, MD  SUMAtriptan (IMITREX) 100 MG tablet Take 1 tablet (100 mg total) by mouth as needed for migraine. One for headache, no more than 3 per week 07/19/15   Butch Penny, NP    ROS:  Out of a complete 14 system review of symptoms, the patient complains only of the following symptoms, and all other reviewed systems are negative.  Blurred vision Achy muscles Environmental allergies Headache, tremors  There were no vitals taken for this visit.  Physical Exam  General: The patient is alert and cooperative at the time of the examination. The patient is moderately obese.  Neuromuscular: Range of movement of the cervical spine and low back are normal.  Skin: No significant peripheral edema is noted.   Neurologic Exam  Mental status: The patient is alert and oriented x 3 at the time of the examination. The patient has apparent normal recent and remote memory, with an apparently normal attention span and concentration ability.   Cranial nerves: Facial symmetry is present. Speech is normal, no aphasia or dysarthria  is noted. Extraocular movements are full. Visual fields are full.  Motor: The patient has good strength in all 4 extremities.  Sensory examination: Soft touch sensation is symmetric on the face, arms, and legs.  Coordination: The patient has good finger-nose-finger and heel-to-shin bilaterally.  Gait and station: The patient has a normal gait. Tandem gait is normal. Romberg is negative. No drift is seen.  Reflexes: Deep tendon reflexes are symmetric.   MRI brain 11/05/15:  IMPRESSION:  Slightly  abnormal MRI scan of the brain showing tiny nonspecific left periventricular and posterior parietal subcortical hyperintensities with differential discussed above. 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 Compared with previous MRI report from 06/04/14   essentially no significant change though number of nonspecific specific white matter hyperintensities appears less but this may be due to technical differences between the 2 scans.  * MRI scan images were reviewed online. I agree with the written report.   MRI cervical 12/03/15:  IMPRESSION:  This MRI of the cervical spine with and without contrast shows the following: 1.   At C5-C6 there is left greater than right uncovertebral spurring and disc bulging causing moderate left foraminal narrowing. Although there is no definite nerve root compression there is some encroachment upon the left C6 nerve root. The right neural foramen is only minimally narrowed. 2.   There are mild degenerative changes at C4-C5 and C6-C7 that does not lead to any nerve root impingement. 3.   Compared to the MRI dated 04/26/2014, there is no definite interval change. 4.   The spinal cord appears normal. There is a normal enhancement pattern.  * MRI scan images were reviewed online. I agree with the written report.    Assessment/Plan:  1. Migraine headache  2. Fatigue, chronic  3. Reported concentration deficits  4. Neuromuscular discomfort, muscle cramps  The patient is having generalized sensations of fatigue, decreased cognitive processing. She denies any issues with getting to sleep at night or staying asleep. The patient may have a fibromyalgia syndrome. She will go up on the Cymbalta taking 60 mg daily, we will check blood work for vitamin D level. The patient will consider going on magnesium supplementation for the muscle cramps. She will follow-up in 6 months, sooner if needed.  Marlan Palau MD 01/20/2016 7:57 AM  [facility] [address] [place], [place] [address]  Phone [phone] Fax [phone]

## 2016-01-20 NOTE — Patient Instructions (Addendum)
   We will go up on the Cymbalta to 60 mg daily. May try magnesium suplimentation for the muscle cramps.   Musculoskeletal Pain Musculoskeletal pain is muscle and boney aches and pains. These pains can occur in any part of the body. Your caregiver may treat you without knowing the cause of the pain. They may treat you if blood or urine tests, X-rays, and other tests were normal.  CAUSES There is often not a definite cause or reason for these pains. These pains may be caused by a type of germ (virus). The discomfort may also come from overuse. Overuse includes working out too hard when your body is not fit. Boney aches also come from weather changes. Bone is sensitive to atmospheric pressure changes. HOME CARE INSTRUCTIONS   Ask when your test results will be ready. Make sure you get your test results.  Only take over-the-counter or prescription medicines for pain, discomfort, or fever as directed by your caregiver. If you were given medications for your condition, do not drive, operate machinery or power tools, or sign legal documents for 24 hours. Do not drink alcohol. Do not take sleeping pills or other medications that may interfere with treatment.  Continue all activities unless the activities cause more pain. When the pain lessens, slowly resume normal activities. Gradually increase the intensity and duration of the activities or exercise.  During periods of severe pain, bed rest may be helpful. Lay or sit in any position that is comfortable.  Putting ice on the injured area.  Put ice in a bag.  Place a towel between your skin and the bag.  Leave the ice on for 15 to 20 minutes, 3 to 4 times a day.  Follow up with your caregiver for continued problems and no reason can be found for the pain. If the pain becomes worse or does not go away, it may be necessary to repeat tests or do additional testing. Your caregiver may need to look further for a possible cause. SEEK IMMEDIATE MEDICAL  CARE IF:  You have pain that is getting worse and is not relieved by medications.  You develop chest pain that is associated with shortness or breath, sweating, feeling sick to your stomach (nauseous), or throw up (vomit).  Your pain becomes localized to the abdomen.  You develop any new symptoms that seem different or that concern you. MAKE SURE YOU:   Understand these instructions.  Will watch your condition.  Will get help right away if you are not doing well or get worse.   This information is not intended to replace advice given to you by your health care provider. Make sure you discuss any questions you have with your health care provider.   Document Released: 06/12/2005 Document Revised: 09/04/2011 Document Reviewed: 02/14/2013 Elsevier Interactive Patient Education [employer]

## 2016-01-21 LAB — VITAMIN D 25 HYDROXY (VIT D DEFICIENCY, FRACTURES): VIT D 25 HYDROXY: 12.5 ng/mL — AB (ref 30.0–100.0)

## 2016-03-22 ENCOUNTER — Other Ambulatory Visit: Payer: Self-pay | Admitting: Adult Health

## 2016-04-04 ENCOUNTER — Telehealth: Payer: Self-pay | Admitting: Adult Health

## 2016-04-04 MED ORDER — PREDNISONE 10 MG PO TABS: ORAL_TABLET | ORAL | 0 refills | Status: DC

## 2016-04-04 NOTE — Telephone Encounter (Signed)
The patient is having some discomfort in the left shoulder, has known cervical spine arthritis, we will try a prednisone Dosepak, if this is not effective, we may go on to do epidural steroid injections of the cervical spine. The patient also has fibromyalgia.

## 2016-04-04 NOTE — Telephone Encounter (Addendum)
Pt called to advise her symptoms are getting worse. She said the Cymbalta is not helping. She has pain at the shoulder that goes down the right arm. Please call

## 2016-04-04 NOTE — Telephone Encounter (Signed)
Pt at last visit was taking 30mg  cymbalta daily.  Was increased 01-20-16 to 60mg  daily.  States worsening sx.

## 2016-04-05 ENCOUNTER — Telehealth: Payer: Self-pay | Admitting: Neurology

## 2016-04-05 ENCOUNTER — Encounter: Payer: Self-pay | Admitting: Adult Health

## 2016-04-05 MED ORDER — MODAFINIL 100 MG PO TABS: 100.0000 mg | ORAL_TABLET | Freq: Every day | ORAL | 3 refills | Status: DC

## 2016-04-05 NOTE — Telephone Encounter (Signed)
I called the patient. I left a message, I will call back later. 

## 2016-04-05 NOTE — Telephone Encounter (Signed)
I called patient. The patient is having general fatigue issues, she is having some pain down the right arm, she denies any neck discomfort.  We will try a low dose of Provigil if we get this cleared through her insurance company.

## 2016-04-06 NOTE — Telephone Encounter (Signed)
Rx printed, signed, faxed to pharmacy. May require PA. 

## 2016-04-12 NOTE — Telephone Encounter (Signed)
Modafinil was denied by her insurance and she does not meet their criteria for coverage.  The patient has been notified.  The cash price was checked on covermymeds but was cost prohibitive.  She is requesting an alternate treatment.  She is aware this will be addressed upon Dr. Anne HahnWillis' return on 04/13/16.

## 2016-04-12 NOTE — Telephone Encounter (Signed)
I called the patient. The Provigil was denied. We could try adderall, if she is ok with this, she is to contact our office.

## 2016-04-12 NOTE — Addendum Note (Signed)
Addended by: Stephanie AcreWILLIS, Graysyn Bache on: 04/12/2016 06:46 PM   Modules accepted: Orders

## 2016-04-13 ENCOUNTER — Encounter: Payer: Self-pay | Admitting: Neurology

## 2016-04-19 ENCOUNTER — Other Ambulatory Visit: Payer: Self-pay | Admitting: Neurology

## 2016-04-19 MED ORDER — AMPHETAMINE-DEXTROAMPHETAMINE 15 MG PO TABS: 15.0000 mg | ORAL_TABLET | Freq: Every day | ORAL | 0 refills | Status: DC

## 2016-04-19 NOTE — Progress Notes (Signed)
Rx printed, signed, up front for pick-up. 

## 2016-04-20 ENCOUNTER — Telehealth: Payer: Self-pay | Admitting: *Deleted

## 2016-04-20 NOTE — Telephone Encounter (Signed)
PA for Adderall begun on Cover My Meds.

## 2016-04-20 NOTE — Telephone Encounter (Signed)
PA denied through Cover My Meds.  Information [facility] to [facility] Charity fundraiserN.

## 2016-04-21 MED ORDER — AMANTADINE HCL 100 MG PO CAPS: 100.0000 mg | ORAL_CAPSULE | Freq: Two times a day (BID) | ORAL | 1 refills | Status: DC

## 2016-04-21 NOTE — Telephone Encounter (Signed)
I called patient. Amantadine will be called in, Adderall was not covered through her insurance.

## 2016-04-21 NOTE — Addendum Note (Signed)
Addended by: Stephanie AcreWILLIS, CHARLES on: 04/21/2016 02:41 PM   Modules accepted: Orders

## 2016-05-11 ENCOUNTER — Ambulatory Visit (INDEPENDENT_AMBULATORY_CARE_PROVIDER_SITE_OTHER): Payer: BC Managed Care – PPO | Admitting: Neurology

## 2016-05-11 ENCOUNTER — Telehealth: Payer: Self-pay | Admitting: Neurology

## 2016-05-11 ENCOUNTER — Encounter: Payer: Self-pay | Admitting: Neurology

## 2016-05-11 VITALS — BP 144/86 | HR 76 | Ht 62.5 in | Wt 194.5 lb

## 2016-05-11 DIAGNOSIS — R251 Tremor, unspecified: Secondary | ICD-10-CM

## 2016-05-11 DIAGNOSIS — M797 Fibromyalgia: Secondary | ICD-10-CM

## 2016-05-11 DIAGNOSIS — R5382 Chronic fatigue, unspecified: Secondary | ICD-10-CM

## 2016-05-11 DIAGNOSIS — G43009 Migraine without aura, not intractable, without status migrainosus: Secondary | ICD-10-CM

## 2016-05-11 MED ORDER — ALPRAZOLAM 0.25 MG PO TABS: 0.2500 mg | ORAL_TABLET | Freq: Three times a day (TID) | ORAL | 0 refills | Status: DC | PRN

## 2016-05-11 NOTE — Telephone Encounter (Signed)
Returned call and spoke to pt. She has h/o migraines, tremor and chronic fatigue. Currently c/o weakness, shaking and numbness in fingers of both hands. Agreed to come in for work-in visit this afternoon by 1:45 for a 2:00 appt. 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

## 2016-05-11 NOTE — Progress Notes (Signed)
Reason for visit: Tremor  Kelly Fields is an [age] y.o. female  History of present illness:  Kelly Fields is a 45 year old right-handed white female with a history of a chronic fatigue syndrome, fibromyalgia. The patient comes to the office on an urgent basis with onset around 9 AM today of dizziness. The patient denied any nausea with this. The patient began noting an internal sensation of tremulousness, but then developed tremor on both upper extremities, right greater than left. The patient had tingling into the fourth and fifth fingers of the hands that has persisted since that time. She reports no neck pain or pain down the arms. She has not had any fevers or chills, no shortness of breath or chest pain have been noted and no palpitations of the heart have been noted. The patient has improved somewhat with the tremor, but the tremor still persists. She has not had any blacking out episodes. She has been eating and drinking well and there have been no changes in her medications. She has not missed any medication doses. She comes to this office for an evaluation.  Past Medical History:  Diagnosis Date  . Endometriosis   . Migraine   . Tremor     Past Surgical History:  Procedure Laterality Date  . ABDOMINAL HYSTERECTOMY    . APPENDECTOMY    . CHOLECYSTECTOMY      Family History  Problem Relation Age of Onset  . Neuropathy Mother   . Restless legs syndrome Mother   . Transient ischemic attack Father     Social history:  reports that she has never smoked. She has never used smokeless tobacco. She reports that she does not drink alcohol or use drugs.   No Known Allergies  Medications:  Prior to Admission medications   Medication Sig Start Date End Date Taking? Authorizing Provider  amantadine (SYMMETREL) 100 MG capsule Take 1 capsule (100 mg total) by mouth 2 (two) times daily. 04/21/16  Yes York Spanielharles K Tenise Stetler, MD  DULoxetine (CYMBALTA) 60 MG capsule Take 1 capsule (60  mg total) by mouth daily. 01/20/16  Yes York Spanielharles K Franchot Pollitt, MD  Estrogens Conjugated (PREMARIN PO) Take by mouth.   Yes Historical Provider, MD  ibuprofen (ADVIL,MOTRIN) 200 MG tablet Take 200 mg by mouth every 6 (six) hours as needed for pain.   Yes Historical Provider, MD  naproxen (NAPROSYN) 500 MG tablet Take 500 mg by mouth every 12 (twelve) hours as needed. for pain 04/21/14  Yes Historical Provider, MD  SUMAtriptan (IMITREX) 100 MG tablet Take 1 tablet (100 mg total) by mouth as needed for migraine. One for headache, no more than 3 per week 07/19/15  Yes Butch PennyMegan Millikan, NP    ROS:  Out of a complete 14 system review of symptoms, the patient complains only of the following symptoms, and all other reviewed systems are negative.  Fatigue Daytime sleepiness Achy muscles, muscle cramps, walking difficulty Dizziness, numbness, weakness, tremors Environmental allergies  Blood pressure (!) 144/86, pulse 76, height 5' 2.5" (1.588 m), weight 194 lb 8 oz (88.2 kg).  Physical Exam  General: The patient is alert and cooperative at the time of the examination. The patient is markedly obese.  Respiratory: Lung fields are clear.  Cardiovascular: Regular rate and rhythm, no obvious murmurs or rubs are noted.   Skin: No significant peripheral edema is noted.   Neurologic Exam  Mental status: The patient is alert and oriented x 3 at the time of the examination.  The patient has apparent normal recent and remote memory, with an apparently normal attention span and concentration ability.   Cranial nerves: Facial symmetry is present. Speech is normal, no aphasia or dysarthria is noted. Extraocular movements are full. Visual fields are full.  Motor: The patient has good strength in all 4 extremities.  Sensory examination: Soft touch sensation is symmetric on the face, arms, and legs.  Coordination: The patient has good finger-nose-finger and heel-to-shin bilaterally.With arms outstretched, there  is a fine action type tremor seen in the right greater than left upper extremities.   Gait and station: The patient has a normal gait. Tandem gait is normal. Romberg is negative. No drift is seen.  Reflexes: Deep tendon reflexes are symmetric.   Assessment/Plan:  1. Dizziness, tremor   2. Chronic fatigue  3. Fibromyalgia  The etiology of the new symptoms is not clear. The patient denies any underlying sensation of anxiety, no fevers or chills have been noted, the patient reports no nausea. The patient will be set up for blood work today, a prescription for alprazolam was given with 0.25 mg tablets take 1 tablet 3 times a day for the tremor.  Marlan Palau. Keith Darnette Lampron MD 05/11/2016 2:19 PM  [facility] [address] [place], [place] [id number]  Phone [phone] Fax [phone]

## 2016-05-11 NOTE — Telephone Encounter (Signed)
Pt called in stating she was sent home from work , she was shaking uncontrollably, light headed and fingers are going numb on both hands. I have suggested she may need to go to the ER for eval. Please call

## 2016-05-12 LAB — CBC WITH DIFFERENTIAL/PLATELET
BASOS ABS: 0 10*3/uL (ref 0.0–0.2)
Basos: 0 %
EOS (ABSOLUTE): 0.1 10*3/uL (ref 0.0–0.4)
Eos: 2 %
Hematocrit: 40.9 % (ref 34.0–46.6)
Hemoglobin: 14 g/dL (ref 11.1–15.9)
IMMATURE GRANS (ABS): 0 10*3/uL (ref 0.0–0.1)
IMMATURE GRANULOCYTES: 0 %
LYMPHS: 27 %
Lymphocytes Absolute: 1.8 10*3/uL (ref 0.7–3.1)
MCH: 30.7 pg (ref 26.6–33.0)
MCHC: 34.2 g/dL (ref 31.5–35.7)
MCV: 90 fL (ref 79–97)
Monocytes Absolute: 0.5 10*3/uL (ref 0.1–0.9)
Monocytes: 7 %
NEUTROS PCT: 64 %
Neutrophils Absolute: 4.3 10*3/uL (ref 1.4–7.0)
PLATELETS: 410 10*3/uL — AB (ref 150–379)
RBC: 4.56 x10E6/uL (ref 3.77–5.28)
RDW: 13.1 % (ref 12.3–15.4)
WBC: 6.8 10*3/uL (ref 3.4–10.8)

## 2016-05-12 LAB — COMPREHENSIVE METABOLIC PANEL
ALK PHOS: 107 IU/L (ref 39–117)
ALT: 16 IU/L (ref 0–32)
AST: 18 IU/L (ref 0–40)
Albumin/Globulin Ratio: 1.8 (ref 1.2–2.2)
Albumin: 4.6 g/dL (ref 3.5–5.5)
BILIRUBIN TOTAL: 0.4 mg/dL (ref 0.0–1.2)
BUN/Creatinine Ratio: 10 (ref 9–23)
BUN: 7 mg/dL (ref 6–24)
CALCIUM: 9.8 mg/dL (ref 8.7–10.2)
CHLORIDE: 101 mmol/L (ref 96–106)
CO2: 24 mmol/L (ref 18–29)
Creatinine, Ser: 0.73 mg/dL (ref 0.57–1.00)
GFR calc Af Amer: 113 mL/min/{1.73_m2} (ref >59)
GFR calc non Af Amer: 98 mL/min/{1.73_m2} (ref >59)
Globulin, Total: 2.5 g/dL (ref 1.5–4.5)
Glucose: 72 mg/dL (ref 65–99)
Potassium: 4.8 mmol/L (ref 3.5–5.2)
Sodium: 140 mmol/L (ref 134–144)
Total Protein: 7.1 g/dL (ref 6.0–8.5)

## 2016-05-12 LAB — TSH: TSH: 2.89 u[IU]/mL (ref 0.450–4.500)

## 2016-07-20 ENCOUNTER — Other Ambulatory Visit: Payer: Self-pay | Admitting: Adult Health

## 2016-07-24 ENCOUNTER — Ambulatory Visit (INDEPENDENT_AMBULATORY_CARE_PROVIDER_SITE_OTHER): Payer: BC Managed Care – PPO | Admitting: Adult Health

## 2016-07-24 ENCOUNTER — Encounter: Payer: Self-pay | Admitting: Adult Health

## 2016-07-24 VITALS — Resp 16 | Ht 62.5 in | Wt 197.0 lb

## 2016-07-24 DIAGNOSIS — R5382 Chronic fatigue, unspecified: Secondary | ICD-10-CM | POA: Diagnosis not present

## 2016-07-24 DIAGNOSIS — M797 Fibromyalgia: Secondary | ICD-10-CM | POA: Diagnosis not present

## 2016-07-24 DIAGNOSIS — G9332 Myalgic encephalomyelitis/chronic fatigue syndrome: Secondary | ICD-10-CM

## 2016-07-24 MED ORDER — DULOXETINE HCL 60 MG PO CPEP: 60.0000 mg | ORAL_CAPSULE | Freq: Every day | ORAL | 11 refills | Status: DC

## 2016-07-24 NOTE — Progress Notes (Signed)
I have read the note, and I agree with the clinical assessment and plan.  Kelly Fields KEITH   

## 2016-07-24 NOTE — Progress Notes (Signed)
PATIENT: Kelly Fields DOB: 03/29/1971  REASON FOR VISIT: follow up- Or myalgia HISTORY FROM: patient  HISTORY OF PRESENT ILLNESS:  Kelly Fields is a 46 year old female with a history of chronic fatigue syndrome thought to be fibromyalgia. She returns today for follow-up. She states that she has continued to have good benefit with Cymbalta. She reports that she recently went to [place]. While there she saw a specialist for fibromyalgia name Dr. Floria Raveling. She states that he did several tests and found that she has Lyme's disease. She reports that they also did a Flagyl challenge that reacted. The patient was placed on 2 antibiotics Flagyl and doxycycline. She is also given a prescription for gabapentin. She states that she has not been feeling well over the last month. However this physician made her aware that she would not feel great. She returns today for an evaluation.  HISTORY 05/11/16: Kelly Fields is a 46 year old right-handed white female with a history of a chronic fatigue syndrome, fibromyalgia. The patient comes to the office on an urgent basis with onset around 9 AM today of dizziness. The patient denied any nausea with this. The patient began noting an internal sensation of tremulousness, but then developed tremor on both upper extremities, right greater than left. The patient had tingling into the fourth and fifth fingers of the hands that has persisted since that time. She reports no neck pain or pain down the arms. She has not had any fevers or chills, no shortness of breath or chest pain have been noted and no palpitations of the heart have been noted. The patient has improved somewhat with the tremor, but the tremor still persists. She has not had any blacking out episodes. She has been eating and drinking well and there have been no changes in her medications. She has not missed any medication doses. She comes to this office for an evaluation.  REVIEW OF SYSTEMS:  Out of a complete 14 system review of symptoms, the patient complains only of the following symptoms, and all other reviewed systems are negative.  Dizziness, headache, tremors, joint pain, back pain, aching muscles, muscle cramps, neck stiffness, neck pain, frequent waking, daytime sleepiness, swollen abdomen, abdominal pain, fatigue  ALLERGIES: No Known Allergies  HOME MEDICATIONS: Outpatient Medications Prior to Visit  Medication Sig Dispense Refill  . ALPRAZolam (XANAX) 0.25 MG tablet Take 1 tablet (0.25 mg total) by mouth 3 (three) times daily as needed for anxiety. 10 tablet 0  . amantadine (SYMMETREL) 100 MG capsule Take 1 capsule (100 mg total) by mouth 2 (two) times daily. 60 capsule 1  . DULoxetine (CYMBALTA) 60 MG capsule Take 1 capsule (60 mg total) by mouth daily. 30 capsule 5  . Estrogens Conjugated (PREMARIN PO) Take by mouth.    Marland Kitchen ibuprofen (ADVIL,MOTRIN) 200 MG tablet Take 200 mg by mouth every 6 (six) hours as needed for pain.    . naproxen (NAPROSYN) 500 MG tablet Take 500 mg by mouth every 12 (twelve) hours as needed. for pain  1  . SUMAtriptan (IMITREX) 100 MG tablet Take 1 tablet (100 mg total) by mouth as needed for migraine. One for headache, no more than 3 per week 10 tablet 11   Facility-Administered Medications Prior to Visit  Medication Dose Route Frequency Provider Last Rate Last Dose  . gadopentetate dimeglumine (MAGNEVIST) injection 19 mL  19 mL Intravenous Once PRN York Spaniel, MD        PAST MEDICAL HISTORY: Past Medical History:  Diagnosis Date  . Endometriosis   . Migraine   . Tremor     PAST SURGICAL HISTORY: Past Surgical History:  Procedure Laterality Date  . ABDOMINAL HYSTERECTOMY    . APPENDECTOMY    . CHOLECYSTECTOMY      FAMILY HISTORY: Family History  Problem Relation Age of Onset  . Neuropathy Mother   . Restless legs syndrome Mother   . Transient ischemic attack Father     SOCIAL HISTORY: Social History   Social  History  . Marital status: Single    Spouse name: N/A  . Number of children: 0  . Years of education: College   Occupational History  . Teacher     [employer]   Social History Main Topics  . Smoking status: Never Smoker  . Smokeless tobacco: Never Used  . Alcohol use No  . Drug use: No  . Sexual activity: No   Other Topics Concern  . Not on file   Social History Narrative   Patient lives at home and her parent's live with her.    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   Right handed.   Caffeine two cups daily.      PHYSICAL EXAM  Vitals:   07/24/16 0737  Resp: 16  Weight: 197 lb (89.4 kg)  Height: 5' 2.5" (1.588 m)   Body mass index is 35.46 kg/m.  Generalized: Well developed, in no acute distress   Neurological examination  Mentation: Alert oriented to time, place, history taking. Follows all commands speech and language fluent Cranial nerve II-XII: Pupils were equal round reactive to light. Extraocular movements were full, visual field were full on confrontational test. Facial sensation and strength were normal. Uvula tongue midline. Head turning and shoulder shrug  were normal and symmetric. Motor: The motor testing reveals 5 over 5 strength of all 4 extremities. Good symmetric motor tone is noted throughout.  Sensory: Sensory testing is intact to soft touch on all 4 extremities. No evidence of extinction is noted.  Coordination: Cerebellar testing reveals good finger-nose-finger and heel-to-shin bilaterally.  Gait and station: Gait is normal. Tandem gait is normal. Romberg is negative. No drift is seen.  Reflexes: Deep tendon reflexes are symmetric and normal bilaterally.   DIAGNOSTIC DATA (LABS, IMAGING, TESTING) - I reviewed patient records, labs, notes, testing and imaging myself where available.  Lab Results  Component Value Date   WBC 6.8 05/11/2016   HGB 13.6 08/31/2008   HCT 40.9 05/11/2016   MCV 90 05/11/2016     PLT 410 (H) 05/11/2016      Component Value Date/Time   NA 140 05/11/2016 1522   K 4.8 05/11/2016 1522   CL 101 05/11/2016 1522   CO2 24 05/11/2016 1522   GLUCOSE 72 05/11/2016 1522   GLUCOSE 89 08/31/2008 0955   BUN 7 05/11/2016 1522   CREATININE 0.73 05/11/2016 1522   CALCIUM 9.8 05/11/2016 1522   PROT 7.1 05/11/2016 1522   ALBUMIN 4.6 05/11/2016 1522   AST 18 05/11/2016 1522   ALT 16 05/11/2016 1522   ALKPHOS 107 05/11/2016 1522   BILITOT 0.4 05/11/2016 1522   GFRNONAA 98 05/11/2016 1522   GFRAA 113 05/11/2016 1522     Lab Results  Component Value Date   TSH 2.890 05/11/2016      ASSESSMENT AND PLAN 46 y.o. year old female  has a past medical history of Endometriosis; Migraine; and Tremor. here with:  1. Fibromyalgia 2. Chronic  Fatigue syndrome  The patient will continue on Cymbalta. She is now being treated for Lyme's disease by Dr. Brooke Bonito in [place]. She reports that she has a telephone appointment with him in February. Patient advised that if her symptoms worsen or she develops new symptoms she should let us know. She will follow-up in 6 months or sooner if needed.     Butch Penny, MSN, NP-C 07/24/2016, 7:31 AM [facility] [address], [address] [place], [place] [address] [phone]

## 2016-07-24 NOTE — Patient Instructions (Signed)
Continue to Cymbalta If your symptoms worsen or you develop new symptoms please let us know.

## 2016-07-31 ENCOUNTER — Telehealth: Payer: Self-pay | Admitting: *Deleted

## 2016-07-31 NOTE — Telephone Encounter (Addendum)
Called and LVM for pt to call back and discuss med refill requests we received for lidocaine ointment, diclofenac sodium gel, doxepin HCL cream, calciptrience creamflucinonide cream. Do not see where our office prescribed these in the past. No mention in OV note.

## 2016-08-03 NOTE — Telephone Encounter (Signed)
Dr Anne HahnWillis ok to refill. He signed refill requests. I faxed back to [phone]. Received confirmation. Wynona Canes([facility]) Rx refills included:  Lidocaine ointment USP 5%-dispense 240gm, apply 2G to affected areas 3-4 times daily. Do not exceed 8grams/day.  Diclofenac sodium gel 3%-dispense 300gm, apply 2-3 grams 4x/day Doxepin HCL cream 5%, dispense 180gm, apply 1-2 gm topically, 3x/day to affected area. If feeling drowsy, apply at bed time.   Calcipotriene cream 0.005%, dispense 360g, apply2-3 grams to affected are 3-4 times/day Flucinonide cream USP 0.1%, dispense  360gm, apply 2-3 gm to affected area 3-4 times daily.

## 2016-08-15 ENCOUNTER — Other Ambulatory Visit: Payer: Self-pay | Admitting: Neurology

## 2016-10-10 ENCOUNTER — Other Ambulatory Visit: Payer: Self-pay | Admitting: Neurology

## 2016-10-19 ENCOUNTER — Ambulatory Visit (INDEPENDENT_AMBULATORY_CARE_PROVIDER_SITE_OTHER): Payer: BC Managed Care – PPO | Admitting: Neurology

## 2016-10-19 ENCOUNTER — Encounter: Payer: Self-pay | Admitting: Neurology

## 2016-10-19 VITALS — BP 121/90 | HR 81 | Ht 62.5 in | Wt 198.0 lb

## 2016-10-19 DIAGNOSIS — G43009 Migraine without aura, not intractable, without status migrainosus: Secondary | ICD-10-CM

## 2016-10-19 DIAGNOSIS — M797 Fibromyalgia: Secondary | ICD-10-CM

## 2016-10-19 DIAGNOSIS — R5382 Chronic fatigue, unspecified: Secondary | ICD-10-CM

## 2016-10-19 NOTE — Patient Instructions (Signed)
   Stop the amantidine, we will get you set up for a sleep evaluation.

## 2016-10-19 NOTE — Progress Notes (Signed)
Reason for visit: Chronic fatigue  Kelly Fields is an 46 y.o. Fields  History of present illness:  Kelly Fields is a 46 year old right-handed white Fields with a history of chronic fatigue syndrome and a history of migraine headache. The patient claims that she is doing very well with her migraine, she has not had a headache in quite some time. The patient apparently has been diagnosed with Lyme disease, she remains on antibiotics for this. The patient has been on amantadine for fatigue, but this has not helped. She reports that she sleeps well at night, but she feels fatigued throughout the day, she has excessive daytime drowsiness if she is inactive she will tend to fall asleep. The patient has had some troubles with word finding, she is having problems with saying and reading the wrong word. She believes that this issue has worsened along with her fatigue. The patient has had MRI evaluation of the brain, this was reviewed online today and appears to be essentially normal. The patient also reports some intermittent right hand numbness that has been present for several months.  Past Medical History:  Diagnosis Date  . Endometriosis   . Migraine   . Tremor     Past Surgical History:  Procedure Laterality Date  . ABDOMINAL HYSTERECTOMY    . APPENDECTOMY    . CHOLECYSTECTOMY      Family History  Problem Relation Age of Onset  . Neuropathy Mother   . Restless legs syndrome Mother   . Transient ischemic attack Father     Social history:  reports that she has never smoked. She has never used smokeless tobacco. She reports that she does not drink alcohol or use drugs.   No Known Allergies  Medications:  Prior to Admission medications   Medication Sig Start Date End Date Taking? Authorizing Provider  ALPRAZolam (XANAX) 0.25 MG tablet Take 1 tablet (0.25 mg total) by mouth 3 (three) times daily as needed for anxiety. 05/11/16  Yes York Spaniel, MD  amantadine  (SYMMETREL) 100 MG capsule TAKE ONE CAPSULE BY MOUTH TWICE A DAY 10/10/16  Yes York Spaniel, MD  DULoxetine (CYMBALTA) 60 MG capsule Take 1 capsule (60 mg total) by mouth daily. 07/24/16  Yes Butch Penny, NP  Estrogens Conjugated (PREMARIN PO) Take by mouth.   Yes Historical Provider, MD  gabapentin (NEURONTIN) 600 MG tablet  07/20/16  Yes Historical Provider, MD  ibuprofen (ADVIL,MOTRIN) 200 MG tablet Take 200 mg by mouth every 6 (six) hours as needed for pain.   Yes Historical Provider, MD  metroNIDAZOLE (FLAGYL) 250 MG tablet TAKE 1 TABLET BY MOUTH 3 TIMES A DAY FRI-SUN 06/27/16  Yes Historical Provider, MD  naproxen (NAPROSYN) 500 MG tablet Take 500 mg by mouth every 12 (twelve) hours as needed. for pain 04/21/14  Yes Historical Provider, MD  SUMAtriptan (IMITREX) 100 MG tablet Take 1 tablet (100 mg total) by mouth as needed for migraine. One for headache, no more than 3 per week 07/19/15  Yes Butch Penny, NP    ROS:  Out of a complete 14 system review of symptoms, the patient complains only of the following symptoms, and all other reviewed systems are negative.  Fatigue Numbness, right hand Tremors Joint pain, achy muscles Daytime sleepiness  Blood pressure 121/90, pulse 81, height 5' 2.5" (1.588 m), weight 198 lb (89.8 kg).  Physical Exam  General: The patient is alert and cooperative at the time of the examination.  Skin: No significant peripheral  edema is noted.   Neurologic Exam  Mental status: The patient is alert and oriented x 3 at the time of the examination. The patient has apparent normal recent and remote memory, with an apparently normal attention span and concentration ability.   Cranial nerves: Facial symmetry is present. Speech is normal, no aphasia or dysarthria is noted. Extraocular movements are full. Visual fields are full.  Motor: The patient has good strength in all 4 extremities.  Sensory examination: Soft touch sensation is symmetric on the face,  arms, and legs.  Coordination: The patient has good finger-nose-finger and heel-to-shin bilaterally.  Gait and station: The patient has a normal gait. Tandem gait is normal. Romberg is negative. No drift is seen.  Reflexes: Deep tendon reflexes are symmetric.   MRi brain 11/05/15:  IMPRESSION:  Slightly abnormal MRI scan of the brain showing tiny nonspecific left periventricular and posterior parietal subcortical hyperintensities with differential discussed above. 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 Compared with previous MRI report from 06/04/14   essentially no significant change though number of nonspecific specific white matter hyperintensities appears less but this may be due to technical differences between the 2 scans.   Assessment/Plan:  1. Chronic fatigue  2. Excessive daytime drowsiness  3. Intermittent right hand numbness  4. Migraine headache  The migraine headaches are well controlled. The patient continues to have ongoing fatigue that is gradually worsening. She has been diagnosed with Lyme disease by a physician in [place], she is on antibiotics for this. The patient will be sent for a sleep evaluation to investigate the etiology of her fatigue and excessive daytime drowsiness. The patient does have significant obesity. The patient is having some intermittent right hand numbness that could represent carpal tunnel syndrome. If this becomes more persistent, we may consider EMG and nerve conduction study evaluation. The patient will follow-up in 6 months otherwise. She will stop the amantadine.  Marlan Palau MD 10/19/2016 2:41 PM  [facility] [address] [place], [place] [address]  Phone [phone] Fax [phone]

## 2016-10-24 ENCOUNTER — Ambulatory Visit (INDEPENDENT_AMBULATORY_CARE_PROVIDER_SITE_OTHER): Payer: BC Managed Care – PPO | Admitting: Neurology

## 2016-10-24 ENCOUNTER — Encounter: Payer: Self-pay | Admitting: Neurology

## 2016-10-24 VITALS — BP 120/81 | HR 84 | Ht 62.0 in | Wt 196.0 lb

## 2016-10-24 DIAGNOSIS — F32A Depression, unspecified: Secondary | ICD-10-CM

## 2016-10-24 DIAGNOSIS — R5383 Other fatigue: Secondary | ICD-10-CM | POA: Diagnosis not present

## 2016-10-24 DIAGNOSIS — F329 Major depressive disorder, single episode, unspecified: Secondary | ICD-10-CM

## 2016-10-24 DIAGNOSIS — G4719 Other hypersomnia: Secondary | ICD-10-CM

## 2016-10-24 DIAGNOSIS — R0683 Snoring: Secondary | ICD-10-CM | POA: Diagnosis not present

## 2016-10-24 DIAGNOSIS — G47429 Narcolepsy in conditions classified elsewhere without cataplexy: Secondary | ICD-10-CM | POA: Diagnosis not present

## 2016-10-24 NOTE — Progress Notes (Signed)
SLEEP MEDICINE CLINIC   Provider:  Melvyn Novas, M D  Primary Care Physician:  Johny Blamer, MD   Referring Provider: Anne Hahn, MD    Chief Complaint  Patient presents with  . New Patient (Initial Visit)    never had a sleep study, snores    HPI:  Kelly Fields is a 46 y.o. female , seen here as in a referral from [facility] Dr. Anne Hahn for an evaluation of hypersomnia.     Chief complaint according to patient : Kelly Fields reports that she feels excessively drowsy during the day when not physically active or mentally stimulated. This has affected her safety at driving. She commutes 30 minutes to school and back.  Sleep habits are as follows: Doing the work week she will go to bed no later than 9:30 and usually is asleep promptly, she is with her mother who has also traveled extensively with her and reports that she sometimes snoring. There has been no observation of apnea. Kelly Fields rises at 5 AM and usually needs an alarm to be up at that time. She usually gets 7-8 hours of nocturnal sleep. She describes her bedroom is cool, quiet and dark. She prefers to sleep on her side. She does not report vivid dreams, not cataplexy. She cannot recall dreams.  She does not wake up with a dry mouth or headaches and denies diaphoresis, nausea, nocturnal headaches stiff neck or palpitations.    Sleep medical history and family sleep history:  She has seen Dr. Anne Hahn in our office for a history of chronic fatigue syndrome and she was diagnosed with Lyme disease ( "chronic Lyme " ) and had remained on long-term antibiotics for this. She had been treated with amantadine for fatigue. She had some trouble with word finding when she presented to Dr. Anne Hahn ordered an MRI of the brain which was essentially normal. She does have a history of endometriosis. There is family history of neuropathy and restless legs and her mom was also a loud snorer.   Social history:  She has been treated by  Floria Raveling, MD, in [place].  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 The patient has a Masters degree in education and counseling.  Review of Systems: Out of a complete 14 system review, the patient complains of only the following symptoms, and all other reviewed systems are negative. The patient presented with excessive daytime sleepiness and an Epworth sleepiness score endorsed at 18 points 3 points were endorsing question 1, 2, 45 and 72 points for question #3, 1 point for question #8.  Epworth score  18 , Fatigue severity score 58  , depression score 4/ 15    Social History   Social History  . Marital status: Single    Spouse name: N/A  . Number of children: 0  . Years of education: College   Occupational History  . Teacher     [employer]   Social History Main Topics  . Smoking status: Never Smoker  . Smokeless tobacco: Never Used  . Alcohol use No  . Drug use: No  . Sexual activity: No   Other Topics Concern  . Not on file   Social History Narrative   Patient lives at home and her parent's live with her.    Patient works full time Optometrist.   Education college  masters.   Right handed.   Caffeine two cups daily.    Family History  Problem Relation Age of Onset  . Neuropathy Mother   . Restless legs syndrome Mother   . Transient ischemic attack Father     Past Medical History:  Diagnosis Date  . Endometriosis   . Migraine   . Tremor     Past Surgical History:  Procedure Laterality Date  . ABDOMINAL HYSTERECTOMY    . APPENDECTOMY    . CHOLECYSTECTOMY      Current Outpatient Prescriptions  Medication Sig Dispense Refill  . ALPRAZolam (XANAX) 0.25 MG tablet Take 1 tablet (0.25 mg total) by mouth 3 (three) times daily as needed for anxiety. 10 tablet 0  . DULoxetine (CYMBALTA) 60 MG capsule Take 1 capsule (60 mg  total) by mouth daily. 30 capsule 11  . Estrogens Conjugated (PREMARIN PO) Take by mouth.    . gabapentin (NEURONTIN) 600 MG tablet     . ibuprofen (ADVIL,MOTRIN) 200 MG tablet Take 200 mg by mouth every 6 (six) hours as needed for pain.    . metroNIDAZOLE (FLAGYL) 250 MG tablet TAKE 1 TABLET BY MOUTH 3 TIMES A DAY FRI-SUN  1  . naproxen (NAPROSYN) 500 MG tablet Take 500 mg by mouth every 12 (twelve) hours as needed. for pain  1  . SUMAtriptan (IMITREX) 100 MG tablet Take 1 tablet (100 mg total) by mouth as needed for migraine. One for headache, no more than 3 per week 10 tablet 11   No current facility-administered medications for this visit.    Facility-Administered Medications Ordered in Other Visits  Medication Dose Route Frequency Provider Last Rate Last Dose  . gadopentetate dimeglumine (MAGNEVIST) injection 19 mL  19 mL Intravenous Once PRN York Spaniel, MD        Allergies as of 10/24/2016  . (No Known Allergies)    Vitals: BP 120/81   Pulse 84   Ht  (1.575 m)   Wt 196 lb (88.9 kg)   BMI 35.85 kg/m  Last Weight:  Wt Readings from Last 1 Encounters:  10/24/16 196 lb (88.9 kg)   ZOX:WRUE mass index is 35.85 kg/m.     Last Height:   Ht Readings from Last 1 Encounters:  10/24/16  (1.575 m)    Physical exam:  General: The patient is awake, alert and appears not in acute distress. The patient is well groomed. Head: Normocephalic, atraumatic. Neck is supple. Mallampati 2,  neck circumference:14. Nasal airflow congested , clicking at TMJ- Retrognathia is seen.  Cardiovascular:  Regular rate and rhythm , without  murmurs or carotid bruit, and without distended neck veins. Respiratory: Lungs are clear to auscultation. Skin:  Without evidence of edema, or rash Trunk: BMI is* elevated . The patient's body shape is pyknic    Neurologic exam : The patient is awake and alert, oriented to place and time.   Memory subjective  described as impaired - not retested  today  Memory testing revealed    MOCA: Montreal Cognitive Assessment  01/07/2015  Visuospatial/ Executive (0/5) 5  Naming (0/3) 3  Attention: Read list of digits (0/2) 2  Attention: Read list of letters (0/1) 1  Attention: Serial 7 subtraction starting at 100 (0/3) 3  Language: Repeat phrase (0/2) 2  Language : Fluency (0/1) 1  Abstraction (0/2) 2  Delayed Recall (0/5) 3  Orientation (0/6) 6  Total 28   MMSE:No flowsheet data found.     Attention  span & concentration ability appears normal.  Speech is fluent,  without dysarthria, dysphonia or aphasia.  Mood and affect are appropriate.  Cranial nerves: Pupils are equal and briskly reactive to light. Funduscopic exam without evidence of pallor or edema.  Extraocular movements  in vertical and horizontal planes intact and without nystagmus. Visual fields by finger perimetry are intact. Hearing to finger rub intact.   Facial sensation intact to fine touch.  Facial motor strength is symmetric and tongue and uvula move midline. Shoulder shrug was symmetrical.   Motor exam: Normal tone, muscle bulk and symmetric strength in all extremities. Sensory:  Fine touch, pinprick and vibration were tested in all extremities. Proprioception tested in the upper extremities was normal. Coordination: Rapid alternating movements in the fingers/hands was normal. Finger-to-nose maneuver  normal without evidence of ataxia, dysmetria or tremor. Gait and station: Patient walks without assistive device and is able unassisted to climb up to the exam table. Strength within normal limits.  Stance is stable and normal. Tandem gait is unfragmented. Turns with  Steps. Romberg testing is  negative. Deep tendon reflexes: in the  upper and lower extremities are symmetric and intact. Babinski maneuver response is downgoing.    Assessment:  After physical and neurologic examination, review of laboratory studies,  Personal review of imaging studies, reports of  other /same  Imaging studies, results of polysomnography and / or neurophysiology testing and pre-existing records as far as provided in visit., my assessment is   1) it is Kelly Fields's body mass index and her excessive daytime sleepiness degree but justifies evaluating her for the presence of sleep apnea. Also she is clearly excessively daytime sleepy with an Epworth scores of 18 she does not endorse associated symptoms that are typically seen in narcoleptic's such as dream intrusion into wakefulness, hypnagogic upon the conversations, visit dreams often nightmarish in character, fragmented nocturnal sleep or cataplectic attacks in daytime.  2) I am not sure that hypersomnia may not be a part of chronic fatigue, but I am sure that is not a part of Lyme disease. There are several viral infections associated with acquired excessive daytime sleepiness, cytomegaly virus  Epstein- barr Virus Infections and  need to test her for Mononucleosis. The Excessive daytime  Sleepiness begun only a year ago.   3) the patient had previously been seen by Dr. Sandria Manly . She had patient is undergone several tests and blood work. It may be sensible to look if these viral parameters have been tested and if she had an thyroid hormone check beyond TSH.   The patient was advised of the nature of the diagnosed disorder , the treatment options and the  risks for general health and wellness arising from not treating the condition.   I spent more than 40 minutes of face to face time with the patient.  Greater than 50% of time was spent in counseling and coordination of care. We have discussed the diagnosis and differential and I answered the patient's questions.    Plan:  Treatment plan and additional workup :  SPLIT night Polysomnography, criteria for  [facility] .  If the PSG does not revela apnea or PLMs will stay for MSLT> RV after sleep test. The patient is not on antidepressant medication. Cymbalta is taken in AM and may  interfere with MSLT.     Melvyn Novas, MD 10/24/2016, 1:20 PM  Certified in Neurology by ABPN Certified in Sleep Medicine by [facility] [address]  [place], [place] [address]

## 2016-11-13 ENCOUNTER — Ambulatory Visit (INDEPENDENT_AMBULATORY_CARE_PROVIDER_SITE_OTHER): Payer: BC Managed Care – PPO | Admitting: Neurology

## 2016-11-13 DIAGNOSIS — G4719 Other hypersomnia: Secondary | ICD-10-CM

## 2016-11-13 DIAGNOSIS — R5383 Other fatigue: Secondary | ICD-10-CM

## 2016-11-13 DIAGNOSIS — F32A Depression, unspecified: Secondary | ICD-10-CM

## 2016-11-13 DIAGNOSIS — R0683 Snoring: Secondary | ICD-10-CM

## 2016-11-13 DIAGNOSIS — G4710 Hypersomnia, unspecified: Secondary | ICD-10-CM

## 2016-11-13 DIAGNOSIS — G47429 Narcolepsy in conditions classified elsewhere without cataplexy: Secondary | ICD-10-CM

## 2016-11-13 DIAGNOSIS — F329 Major depressive disorder, single episode, unspecified: Secondary | ICD-10-CM

## 2016-11-21 NOTE — Procedures (Signed)
PATIENT'S NAME:  Kelly Fields, Kelly Fields DOB:      [birth year]      MR#:    [id number]     DATE OF RECORDING: 11/13/2016 REFERRING M.D.:  Stephanie Acreharles Willis, MD Study Performed:   Baseline Polysomnogram HISTORY: Kelly Fields is a female patient of Dr. Clarisa Fields's, seen at [facility] for an evaluation of hypersomnia.  Chief complaint: Kelly Fields reports that she feels excessively drowsy during the day when not physically active or mentally stimulated. This has affected her safety at driving. She commutes 30 minutes to school and back.  Morbid obesity, EDS, Migraine, Tremor, and Endometriosis. The patient endorsed the Epworth Sleepiness Scale at 18/24 points, FSS at 58, GDS 4/15 points.   The patient's weight 199 pounds with a height of 62 (inches), resulting in a BMI of 36.5 kg/m2. The patient's neck circumference measured 14 inches.  CURRENT MEDICATIONS: Xanax, Cymbalta, Premarin, Neurontin, Advil, Flagyl, Naprosyn, Imitrex.   PROCEDURE:  This is a multichannel digital polysomnogram utilizing the Somnostar 11.2 system.  Electrodes and sensors were applied and monitored per AASM Specifications.   EEG, EOG, Chin and Limb EMG, were sampled at 200 Hz.  ECG, Snore and Nasal Pressure, Thermal Airflow, Respiratory Effort, CPAP Flow and Pressure, Oximetry was sampled at 50 Hz. Digital video and audio were recorded.      BASELINE STUDY Lights Out was at 20:32 and Lights On at 05:48.  Total recording time (TRT) was 556.5 minutes, with a total sleep time (TST) of 521.5 minutes.   The patient's sleep latency was 29 minutes.  REM latency was 122.5 minutes.  The sleep efficiency was 93.7 %.     SLEEP ARCHITECTURE: WASO (Wake after sleep onset) was 5 minutes.  There were 8.5 minutes in Stage N1, 211 minutes Stage N2, 145.5 minutes Stage N3 and 156.5 minutes in Stage REM.  The percentage of Stage N1 was 1.6%, Stage N2 was 40.5%, Stage N3 was 27.9% and Stage R (REM sleep) was 30.%.   RESPIRATORY ANALYSIS:   There were a total of 109 respiratory events:  39 obstructive apneas, 5 central apneas and 0 mixed apneas with a total of 44 apneas and an apnea index (AI) of 5.1 /hour. There were 65 hypopneas with a hypopnea index of 7.5 /hour. The patient also had 0 respiratory event related arousals (RERAs).     The total APNEA/HYPOPNEA INDEX (AHI) was 12.5/hour and the total RESPIRATORY DISTURBANCE INDEX was 12.5 /hour.  80 events occurred in REM sleep and 55 events in NREM. The REM AHI was 30.7 /hour, versus a non-REM AHI of 4.8. The patient spent 0 minutes of total sleep time in the supine position and 522 minutes in non-supine.   OXYGEN SATURATION & C02:  The Wake baseline 02 saturation was 96%, with the lowest being 81%. Time spent below 89% saturation equaled 13 minutes.   PERIODIC LIMB MOVEMENTS:  The patient had a total of 25 Periodic Limb Movements.  The arousals were noted as: 26 were spontaneous, 3 were associated with PLMs, and 18 were associated with respiratory events.  Audio and video analysis did not show any abnormal or unusual movements, behaviors, phonations or vocalizations.  There were no bathroom breaks. LOUD Snoring was noted. EKG was in keeping with normal sinus rhythm (NSR).Post-study, the patient indicated that sleep was the same as usual.   IMPRESSION:  1. Mild Obstructive Sleep Apnea(OSA) with minor SpO2 desaturation, no prolonged hypoxemia noted, nadir was 81% . REM AHI was 30.7, versus a  general AHI of 12.5/hr., making this patient a candidate for CPAP therapy .  2. Primary Snoring 3. High sleep efficiency, long REM latency, high REM sleep proportion at 30%.  RECOMMENDATIONS:  1. Advise full-night, attended, CPAP titration study to optimize therapy. If not able to get to the sleep lab in a timely manner, consider auto-titration device for 30 days, followed by a RV to evaluate the  Epworth score    2. Avoid sedative-hypnotics which may worsen sleep apnea, alcohol and tobacco (as  applicable). 3. Advise to lose weight, diet and exercise if not contraindicated (BMI 36.5) . 4. Advise patient to avoid driving or operating hazardous machinery when sleepy. 5. Further information regarding OSA may be obtained from [employer] (www.sleepfoundation.org) or American Sleep Apnea Association (www.sleepapnea.org). A follow up appointment will be scheduled in the Sleep Clinic at [facility]. The referring provider will be notified of the results.      I certify that I have reviewed the entire raw data recording prior to the issuance of this report in accordance with the Standards of Accreditation of the American Academy of Sleep Medicine (AASM)     Melvyn Novas, MD  11-21-2016  Diplomat, American Board of Psychiatry and Neurology  Diplomat, American Board of Sleep Medicine Medical Director, [place] [facility]

## 2016-11-21 NOTE — Addendum Note (Signed)
Addended by: Melvyn NovasHMEIER, Doyt Castellana on: 11/21/2016 05:36 PM   Modules accepted: Orders

## 2016-11-22 ENCOUNTER — Telehealth: Payer: Self-pay

## 2016-11-22 NOTE — Telephone Encounter (Signed)
I called pt to discuss her sleep study results. No answer, left a message asking her to call me back. 

## 2016-11-22 NOTE — Telephone Encounter (Signed)
-----   Message from Melvyn Novasarmen Dohmeier, MD sent at 11/21/2016  5:36 PM EDT ----- 1. Mild Obstructive Sleep Apnea(OSA) with minor SpO2 desaturation, no prolonged hypoxemia noted, nadir was 81% . REM AHI was 30.7, versus a general AHI of 12.5/hr., making this patient a candidate for CPAP therapy .  2. Primary Snoring 3. High sleep efficiency, long REM latency, high REM sleep proportion at 30%.  RECOMMENDATIONS:  1. Advise full-night, attended, CPAP titration study to optimize therapy. If not able to get to the sleep lab in a timely manner, consider auto-titration device for 30 days, followed by a RV to evaluate the  Epworth score    2. Avoid sedative-hypnotics which may worsen sleep apnea, alcohol and tobacco (as applicable). 3. Advise to lose weight, diet and exercise if not contraindicated (BMI 36.5) . 4. Advise patient to avoid driving or operating hazardous machinery when sleepy.

## 2016-11-27 NOTE — Telephone Encounter (Signed)
I called pt. I advised pt that Dr. Vickey Hugerohmeier reviewed their sleep study results and found that has mild osa with minor O2 desaturations, but no prolonged hypoemia. Pt had snoring but had high sleep efficiency and high REM sleep proportion. Dr. Vickey Hugerohmeier recommends that pt return for a repeat sleep study in order to properly titrate the cpap and ensure a good mask fit. Pt is agreeable to returning for a titration study. I advised pt that our sleep lab will file with pt's insurance and call pt to schedule the sleep study when we hear back from the pt's insurance regarding coverage of this sleep study. if insurance does NOT cover this sleep study. Dr. Vickey Hugerohmeier may consider an auto titration study. I advised pt to avoid sedative-hypnotics which may worsen sleep apnea, alcohol and tobacco. I advised pt to lose weight by diet and exercise if not contraindicated by her other physicians. Pt was advised to not drive or operate hazardous machinery when sleepy.  Pt verbalized understanding of results. Pt had no questions at this time but was encouraged to call back if questions arise.

## 2016-12-04 ENCOUNTER — Ambulatory Visit (INDEPENDENT_AMBULATORY_CARE_PROVIDER_SITE_OTHER): Payer: BC Managed Care – PPO | Admitting: Neurology

## 2016-12-04 DIAGNOSIS — R0683 Snoring: Secondary | ICD-10-CM

## 2016-12-04 DIAGNOSIS — G4733 Obstructive sleep apnea (adult) (pediatric): Secondary | ICD-10-CM

## 2016-12-04 DIAGNOSIS — F32A Depression, unspecified: Secondary | ICD-10-CM

## 2016-12-04 DIAGNOSIS — F329 Major depressive disorder, single episode, unspecified: Secondary | ICD-10-CM

## 2016-12-04 DIAGNOSIS — G4719 Other hypersomnia: Secondary | ICD-10-CM

## 2016-12-04 DIAGNOSIS — G47429 Narcolepsy in conditions classified elsewhere without cataplexy: Secondary | ICD-10-CM

## 2016-12-04 DIAGNOSIS — R5383 Other fatigue: Secondary | ICD-10-CM

## 2016-12-12 ENCOUNTER — Other Ambulatory Visit: Payer: Self-pay | Admitting: Neurology

## 2016-12-21 ENCOUNTER — Telehealth: Payer: Self-pay | Admitting: Neurology

## 2016-12-21 NOTE — Telephone Encounter (Signed)
Patient called office in reference to CPAP titration results.  Please call

## 2016-12-24 NOTE — Addendum Note (Signed)
Addended by: Melvyn NovasHMEIER, Vanilla Heatherington on: 12/24/2016 04:29 PM   Modules accepted: Orders

## 2016-12-24 NOTE — Procedures (Signed)
PATIENT'S NAME:  Kelly Fields, Kelly Fields DOB:      1970/10/26      MR#:    [id number]     DATE OF RECORDING: 12/04/2016 REFERRING M.D.:  Stephanie Acreharles Willis, MD Study Performed:   CPAP  Titration HISTORY:  Patient had a baseline study on 11/13/16 with the following results.  Mild Obstructive Sleep Apnea(OSA) with minor SpO2  desaturation, no prolonged hypoxemia noted, SpO2 nadir was 81%. REM AHI was 30.7, versus a general AHI of 12.5/hr., making this patient a candidate for CPAP therapy .  2. Primary Snoring  The patient endorsed the Epworth Sleepiness Scale at 18/24 points.  The patient's weight 196 pounds with a height of 62 (inches), resulting in a BMI of 36.1 kg/m2.  The patient's neck circumference measured 14 inches.  CURRENT MEDICATIONS: Xanax, Cymbalta, Premarin, Neurontin, Advil, Flagyl, Naprosyn, Imitrex.    PROCEDURE:  This is a multichannel digital PSG utilizing the SomnoStar 11.2 system.  Electrodes and sensors were applied and monitored per AASM Specifications.   EEG, EOG, Chin and Limb EMG, were sampled at 200 Hz.  ECG, Snore and Nasal Pressure, Thermal Airflow, Respiratory Effort, CPAP Flow and Pressure, Oximetry was sampled at 50 Hz. Digital video and audio were recorded.       CPAP was initiated at 5 cmH20 with heated humidity per AASM split night standards and pressure was advanced to 15 cm water with an AHI of 1.1 and 100% sleep efficiency. The technologist changed to BiPAP at 16/12 and increased gradually to 20/16 cm water.  The patient tolerated CPAP at 15 cm and BiPAP at 17/13 cm water equally well.   At a BiPAP pressure of 17/13 cmH20, there was a reduction of the AHI to 0.0, and the patient did not longer snore with improvement of obstructive sleep apnea.    Lights Out was at 20:39 and Lights On at 04:56. Total recording time (TRT) was 497 minutes, with a total sleep time (TST) of 478.5 minutes. The patient's sleep latency was 10 minutes with 0 minutes of wake time after  sleep onset. REM latency was 84.5 minutes.  The sleep efficiency was 96.3 %.    SLEEP ARCHITECTURE: WASO (Wake after sleep onset) was 8.5 minutes.  There were 6.5 minutes in Stage N1, 219 minutes Stage N2, 76 minutes Stage N3 and 177 minutes in Stage REM.  The percentage of Stage N1 was 1.4%, Stage N2 was 45.8%, Stage N3 was 15.9% and Stage R (REM sleep) was 37.%. The sleep architecture was notable for REM sleep rebounding under PAP.    RESPIRATORY ANALYSIS:  There was a total of 48 respiratory events: 38 obstructive apneas, 1 central apnea and 0 mixed apneas with a total of 39 apneas and an apnea index (AI) of 4.9 /hour. There were 9 hypopneas with a hypopnea index of 1.1/hour. The patient also had 0 respiratory event related arousals (RERAs).      The total APNEA/HYPOPNEA INDEX (AHI) was 6. /hour and the total RESPIRATORY DISTURBANCE INDEX was 6.0/ hour. 18 events occurred in REM sleep and 30 events in NREM. The REM AHI was 6.1 /hour versus a non-REM AHI of 6.1 /hour. The patient spent 290.5 minutes of total sleep time in the supine position and 188 minutes in non-supine. The supine AHI was 6.4, versus a non-supine AHI of 5.5.  OXYGEN SATURATION & C02:  The baseline 02 saturation was 98%, with the lowest being 83%. Time spent below 89% saturation equaled 4 minutes.  PERIODIC LIMB MOVEMENTS:  The patient had a total of 83 Periodic Limb Movements. The Periodic Limb Movement (PLM) index was 10.4 and the PLM Arousal index was 0.4 /hour. The arousals were noted as: 15 were spontaneous, 3 were associated with PLMs, 6 were associated with respiratory events. Audio and video analysis did not show any abnormal or unusual movements, behaviors, phonations or vocalizations.  The patient took neither bathroom breaks not left the bed during study. EKG was in keeping with normal sinus rhythm (NSR). DIAGNOSIS 1. Primary Snoring and Obstructive Sleep Apnea responded to CPAP and BiPAP, but BiPAP allowed for longer  uninterrupted sleep.  PLANS/RECOMMENDATIONS: Initiate BiPAP at 17/13 cm water with a RESMED Airfit P 10 and extra small pillows.  a. CPAP clinic follow up in 2-3 months after receiving device; and thereafter, yearly CPAP clinic follow-up is advisable. b. Weight loss if appropriate. c. Avoidance of medications with muscle relaxant properties, if applicable . d. Avoidance of ingestion of alcohol prior to sleeping, if applicable. e. Avoiding sleeping in the supine position (on one's back). f. Improvement of nasal patency if indicated. g. Avoidance of driving if or when sleepy.  A follow up appointment will be scheduled in the Sleep Clinic at [facility].   Please call [phone] with any questions.     I certify that I have reviewed the entire raw data recording prior to the issuance of this report in accordance with the Standards of Accreditation of the American Academy of Sleep Medicine (AASM)  Melvyn Novas, M.D.  12-23-2016 Diplomat, American Board of Psychiatry and Neurology  Diplomat, American Board of Sleep Medicine Medical Director, [place] [facility] at [facility]

## 2016-12-26 ENCOUNTER — Telehealth: Payer: Self-pay | Admitting: Neurology

## 2016-12-26 NOTE — Telephone Encounter (Signed)
-----   Message from Geronimo RunningKristen Dinkins, RN sent at 12/25/2016  7:34 AM EDT -----   ----- Message ----- From: Melvyn Novasohmeier, Carmen, MD Sent: 12/24/2016   4:29 PM To: Geronimo RunningKristen Dinkins, RN, [name]  Patient was treated with CPAP and followed by BiPAP, which allowed for more restful sleep. Recommend 17/13 cm water. Air fit P 10 XS Nasal pillows and BiPAP, heated humidity.

## 2016-12-26 NOTE — Telephone Encounter (Signed)
I called pt. I advised pt that Dr. Vickey Hugerohmeier reviewed their sleep study results and found that pt sleep apnea. Dr. Vickey Hugerohmeier recommends that pt uses Bipap. I reviewed PAP compliance expectations with the pt. Pt is agreeable to starting a BiPAP. I advised pt that an order will be sent to a DME, [facility], and [facility] will call the pt within about one week after they file with the pt's insurance. [facility] will show the pt how to use the machine, fit for masks, and troubleshoot the BiPAP if needed. A follow up appt was made for insurance purposes with Dr. Vickey Hugerohmeier on Wed Oct 17th at 9:30 am. Pt verbalized understanding to arrive 15 minutes early and bring their BiPAP. A letter with all of this information in it will be sent in mychart to the pt as a reminder. Pt verbalized understanding of results. Pt had no questions at this time but was encouraged to call back if questions arise.

## 2017-01-09 ENCOUNTER — Telehealth: Payer: Self-pay | Admitting: *Deleted

## 2017-01-09 NOTE — Telephone Encounter (Signed)
[facility]: triamciolone acetonide topical aerosol (0.147mg /g)- dispense 400gm.  Sig: apply 2-3 sprays to affected areas 3-4 times daily as directed, with 5 refills.  Fax: [phone]. Received confirmation.

## 2017-01-29 ENCOUNTER — Ambulatory Visit: Payer: BC Managed Care – PPO | Admitting: Adult Health

## 2017-02-06 ENCOUNTER — Encounter: Payer: Self-pay | Admitting: Neurology

## 2017-02-17 ENCOUNTER — Other Ambulatory Visit: Payer: Self-pay | Admitting: Adult Health

## 2017-03-12 ENCOUNTER — Encounter: Payer: Self-pay | Admitting: *Deleted

## 2017-03-29 ENCOUNTER — Ambulatory Visit
Admission: RE | Admit: 2017-03-29 | Discharge: 2017-03-29 | Disposition: A | Discharge status: Home / Self Care | Payer: BC Managed Care – PPO | Source: Ambulatory Visit | Attending: Family Medicine | Admitting: Family Medicine

## 2017-03-29 ENCOUNTER — Other Ambulatory Visit: Payer: Self-pay | Admitting: Family Medicine

## 2017-03-29 DIAGNOSIS — R1084 Generalized abdominal pain: Secondary | ICD-10-CM

## 2017-04-05 ENCOUNTER — Ambulatory Visit: Payer: Self-pay | Admitting: Neurology

## 2017-04-05 ENCOUNTER — Telehealth: Payer: Self-pay | Admitting: Neurology

## 2017-04-05 NOTE — Telephone Encounter (Signed)
Pt called and stated that she was sick and would not make it to her apt scheduled today

## 2017-04-11 ENCOUNTER — Ambulatory Visit: Payer: Self-pay | Admitting: Neurology

## 2017-04-23 ENCOUNTER — Ambulatory Visit (INDEPENDENT_AMBULATORY_CARE_PROVIDER_SITE_OTHER): Payer: BC Managed Care – PPO | Admitting: Adult Health

## 2017-04-23 ENCOUNTER — Encounter: Payer: Self-pay | Admitting: Adult Health

## 2017-04-23 VITALS — BP 112/80 | HR 64 | Wt 206.0 lb

## 2017-04-23 DIAGNOSIS — G4733 Obstructive sleep apnea (adult) (pediatric): Secondary | ICD-10-CM | POA: Diagnosis not present

## 2017-04-23 DIAGNOSIS — R5382 Chronic fatigue, unspecified: Secondary | ICD-10-CM | POA: Diagnosis not present

## 2017-04-23 DIAGNOSIS — G9332 Myalgic encephalomyelitis/chronic fatigue syndrome: Secondary | ICD-10-CM

## 2017-04-23 NOTE — Patient Instructions (Addendum)
Your Plan:  Continue Cymbalta Refit for new mask If your symptoms worsen or you develop new symptoms please let us know.   Thank you for coming to see [place] at [facility]. I hope we have been able to provide you high quality care today.  You may receive a patient satisfaction survey over the next few weeks. We would appreciate your feedback and comments so that we may continue to improve ourselves and the health of our patients.   Neck Exercises Neck exercises can be important for many reasons:  They can help you to improve and maintain flexibility in your neck. This can be especially important as you age.  They can help to make your neck stronger. This can make movement easier.  They can reduce or prevent neck pain.  They may help your upper back.  Ask your health care provider which neck exercises would be best for you. Exercises Neck Press Repeat this exercise 10 times. Do it first thing in the morning and right before bed or as told by your health care provider. 1. Lie on your back on a firm bed or on the floor with a pillow under your head. 2. Use your neck muscles to push your head down on the pillow and straighten your spine. 3. Hold the position as well as you can. Keep your head facing up and your chin tucked. 4. Slowly count to 5 while holding this position. 5. Relax for a few seconds. Then repeat.  Isometric Strengthening Do a full set of these exercises 2 times a day or as told by your health care provider. 1. Sit in a supportive chair and place your hand on your forehead. 2. Push forward with your head and neck while pushing back with your hand. Hold for 10 seconds. 3. Relax. Then repeat the exercise 3 times. 4. Next, do thesequence again, this time putting your hand against the back of your head. Use your head and neck to push backward against the hand pressure. 5. Finally, do the same exercise on either side of your head, pushing sideways against  the pressure of your hand.  Prone Head Lifts Repeat this exercise 5 times. Do this 2 times a day or as told by your health care provider. 1. Lie face-down, resting on your elbows so that your chest and upper back are raised. 2. Start with your head facing downward, near your chest. Position your chin either on or near your chest. 3. Slowly lift your head upward. Lift until you are looking straight ahead. Then continue lifting your head as far back as you can stretch. 4. Hold your head up for 5 seconds. Then slowly lower it to your starting position.  Supine Head Lifts Repeat this exercise 8-10 times. Do this 2 times a day or as told by your health care provider. 1. Lie on your back, bending your knees to point to the ceiling and keeping your feet flat on the floor. 2. Lift your head slowly off the floor, raising your chin toward your chest. 3. Hold for 5 seconds. 4. Relax and repeat.  Scapular Retraction Repeat this exercise 5 times. Do this 2 times a day or as told by your health care provider. 1. Stand with your arms at your sides. Look straight ahead. 2. Slowly pull both shoulders backward and downward until you feel a stretch between your shoulder blades in your upper back. 3. Hold for 10-30 seconds. 4. Relax and repeat.  Contact a health care  provider if:  Your neck pain or discomfort gets much worse when you do an exercise.  Your neck pain or discomfort does not improve within 2 hours after you exercise. If you have any of these problems, stop exercising right away. Do not do the exercises again unless your health care provider says that you can. Get help right away if:  You develop sudden, severe neck pain. If this happens, stop exercising right away. Do not do the exercises again unless your health care provider says that you can. Exercises Neck Stretch  Repeat this exercise 3-5 times. 1. Do this exercise while standing or while sitting in a chair. 2. Place your feet flat  on the floor, shoulder-width apart. 3. Slowly turn your head to the right. Turn it all the way to the right so you can look over your right shoulder. Do not tilt or tip your head. 4. Hold this position for 10-30 seconds. 5. Slowly turn your head to the left, to look over your left shoulder. 6. Hold this position for 10-30 seconds.  Neck Retraction Repeat this exercise 8-10 times. Do this 3-4 times a day or as told by your health care provider. 1. Do this exercise while standing or while sitting in a sturdy chair. 2. Look straight ahead. Do not bend your neck. 3. Use your fingers to push your chin backward. Do not bend your neck for this movement. Continue to face straight ahead. If you are doing the exercise properly, you will feel a slight sensation in your throat and a stretch at the back of your neck. 4. Hold the stretch for 1-2 seconds. Relax and repeat.  This information is not intended to replace advice given to you by your health care provider. Make sure you discuss any questions you have with your health care provider. Document Released: 05/24/2015 Document Revised: 11/18/2015 Document Reviewed: 12/21/2014 Elsevier Interactive Patient Education  2017 [employer]

## 2017-04-23 NOTE — Progress Notes (Signed)
PATIENT: Kelly Fields DOB: 1971-01-01  REASON FOR VISIT: follow up- fatigue syndrome, osa on bipap HISTORY FROM: patient  HISTORY OF PRESENT ILLNESS: Today 04/23/17 Kelly Fields is a 46 year old female with a history of chronic fatigue syndrome and migraine headaches. She returns today for follow-up. She reports that she is doing well. She continues on Cymbalta. She does see a physician in [place] that is treating her Lyme's disease. She reports that she is on an antibiotic regimen. She will follow-up with his physician in December. She does report at times she does feel like she has some neck stiffness. She denies any trauma to the neck. The patient has also had a sleep study and BiPAP therapy was recommended. She reports that she does have a machine but she has not been using it. She reports that she does not find the mask very comfortable. She returns today for an evaluation.  HISTORY Kelly Fields is a 46 year old right-handed white female with a history of chronic fatigue syndrome and a history of migraine headache. The patient claims that she is doing very well with her migraine, she has not had a headache in quite some time. The patient apparently has been diagnosed with Lyme disease, she remains on antibiotics for this. The patient has been on amantadine for fatigue, but this has not helped. She reports that she sleeps well at night, but she feels fatigued throughout the day, she has excessive daytime drowsiness if she is inactive she will tend to fall asleep. The patient has had some troubles with word finding, she is having problems with saying and reading the wrong word. She believes that this issue has worsened along with her fatigue. The patient has had MRI evaluation of the brain, this was reviewed online today and appears to be essentially normal. The patient also reports some intermittent right hand numbness that has been present for several months.  REVIEW OF SYSTEMS: Out  of a complete 14 system review of symptoms, the patient complains only of the following symptoms, and all other reviewed systems are negative.  Joint pain, back pain, aching muscles, neck pain, neck stiffness, weakness, tremors, memory loss  ALLERGIES: No Known Allergies  HOME MEDICATIONS: Outpatient Medications Prior to Visit  Medication Sig Dispense Refill  . ALPRAZolam (XANAX) 0.25 MG tablet Take 1 tablet (0.25 mg total) by mouth 3 (three) times daily as needed for anxiety. 10 tablet 0  . DULoxetine (CYMBALTA) 60 MG capsule Take 1 capsule (60 mg total) by mouth daily. 30 capsule 11  . gabapentin (NEURONTIN) 600 MG tablet     . ibuprofen (ADVIL,MOTRIN) 200 MG tablet Take 200 mg by mouth every 6 (six) hours as needed for pain.    . metroNIDAZOLE (FLAGYL) 250 MG tablet TAKE 1 TABLET BY MOUTH 3 TIMES A DAY FRI-SUN  1  . SUMAtriptan (IMITREX) 100 MG tablet TAKE 1 TABLET BY MOUTH AS NEEDED FOR MIGRAINE. ONE FOR HEADACHE, NO MORE THAN 3 PER WEEK. 10 tablet 5  . compounded topicals builder Apply 1 application topically. 3-4 times daily as directed [facility] rx: triamciolone acetonide topical aerosol (0.147mg /g)- dispense 400gm.  Sig: apply 2-3 sprays to affected areas 3-4 times daily as directed, with 5 refills.  Fax: [phone]. Received confirmation.    . Estrogens Conjugated (PREMARIN PO) Take by mouth.    . naproxen (NAPROSYN) 500 MG tablet Take 500 mg by mouth every 12 (twelve) hours as needed. for pain  1   Facility-Administered Medications Prior to Visit  Medication Dose Route Frequency Provider Last Rate Last Dose  . gadopentetate dimeglumine (MAGNEVIST) injection 19 mL  19 mL Intravenous Once PRN York Spaniel, MD        PAST MEDICAL HISTORY: Past Medical History:  Diagnosis Date  . Endometriosis   . Migraine   . Tremor     PAST SURGICAL HISTORY: Past Surgical History:  Procedure Laterality Date  . ABDOMINAL  HYSTERECTOMY    . APPENDECTOMY    . CHOLECYSTECTOMY      FAMILY HISTORY: Family History  Problem Relation Age of Onset  . Neuropathy Mother   . Restless legs syndrome Mother   . Transient ischemic attack Father     SOCIAL HISTORY: Social History   Social History  . Marital status: Single    Spouse name: N/A  . Number of children: 0  . Years of education: College   Occupational History  . Teacher     [employer]   Social History Main Topics  . Smoking status: Never Smoker  . Smokeless tobacco: Never Used  . Alcohol use No  . Drug use: No  . Sexual activity: No   Other Topics Concern  . Not on file   Social History Narrative   Patient lives at home and her parent's live with her.    Patient works full time Optometrist.   Education Psychologist, counselling.   Right handed.   Caffeine two cups daily.      PHYSICAL EXAM  Vitals:   04/23/17 1442  BP: 112/80  Pulse: 64  Weight: 206 lb (93.4 kg)   Body mass index is 37.68 kg/m.  Generalized: Well developed, in no acute distress   Neurological examination  Mentation: Alert oriented to time, place, history taking. Follows all commands speech and language fluent Cranial nerve II-XII: Pupils were equal round reactive to light. Extraocular movements were full, visual field were full on confrontational test. Facial sensation and strength were normal. Uvula tongue midline. Head turning and shoulder shrug  were normal and symmetric. Motor: The motor testing reveals 5 over 5 strength of all 4 extremities. Good symmetric motor tone is noted throughout.  Sensory: Sensory testing is intact to soft touch on all 4 extremities. No evidence of extinction is noted.  Coordination: Cerebellar testing reveals good finger-nose-finger and heel-to-shin bilaterally.  Gait and station: Gait is normal. Tandem gait is normal. Romberg is negative. No drift is seen.  Reflexes: Deep tendon reflexes are symmetric and normal bilaterally.    DIAGNOSTIC DATA (LABS, IMAGING, TESTING) - I reviewed patient records, labs, notes, testing and imaging myself where available.  Lab Results  Component Value Date   WBC 6.8 05/11/2016   HGB 14.0 05/11/2016   HCT 40.9 05/11/2016   MCV 90 05/11/2016   PLT 410 (H) 05/11/2016      Component Value Date/Time   NA 140 05/11/2016 1522   K 4.8 05/11/2016 1522   CL 101 05/11/2016 1522   CO2 24 05/11/2016 1522   GLUCOSE 72 05/11/2016 1522   GLUCOSE 89 08/31/2008 0955   BUN 7 05/11/2016 1522   CREATININE 0.73 05/11/2016 1522   CALCIUM 9.8 05/11/2016 1522   PROT 7.1 05/11/2016 1522   ALBUMIN 4.6 05/11/2016 1522   AST 18 05/11/2016 1522   ALT 16 05/11/2016 1522   ALKPHOS 107 05/11/2016 1522   BILITOT 0.4 05/11/2016 1522   GFRNONAA 98 05/11/2016 1522   GFRAA 113 05/11/2016 1522  Lab Results  Component Value Date   TSH 2.890 05/11/2016      ASSESSMENT AND PLAN 46 y.o. year old female  has a past medical history of Endometriosis; Migraine; and Tremor. here with:  1. Fibromyalgia 2. Obstructive sleep apnea on BiPAP  Overall the patient has remained stable. She will continue on Cymbalta. I will have her set up an appointment in sleep lab to have her mask refitted. She is advised that if her symptoms worsen or she develops new symptoms she should let us know. She will follow-up in 6 months or sooner if needed.     Butch PennyMegan Ladarrious Kirksey, MSN, NP-C 04/23/2017, 3:04 PM [facility] [address], [address] [place], [place] [address] [phone]

## 2017-07-08 ENCOUNTER — Encounter (HOSPITAL_COMMUNITY): Payer: Self-pay | Admitting: Pharmacy Technician

## 2017-07-08 ENCOUNTER — Emergency Department (HOSPITAL_COMMUNITY): Payer: BC Managed Care – PPO

## 2017-07-08 ENCOUNTER — Emergency Department (HOSPITAL_COMMUNITY)
Admission: EM | Admit: 2017-07-08 | Discharge: 2017-07-08 | Disposition: A | Discharge status: Home / Self Care | Payer: BC Managed Care – PPO | Attending: Emergency Medicine | Admitting: Emergency Medicine

## 2017-07-08 DIAGNOSIS — Z79899 Other long term (current) drug therapy: Secondary | ICD-10-CM | POA: Diagnosis not present

## 2017-07-08 DIAGNOSIS — J189 Pneumonia, unspecified organism: Secondary | ICD-10-CM

## 2017-07-08 DIAGNOSIS — R079 Chest pain, unspecified: Secondary | ICD-10-CM | POA: Diagnosis present

## 2017-07-08 LAB — CBC
HCT: 38.4 % (ref 36.0–46.0)
Hemoglobin: 12.6 g/dL (ref 12.0–15.0)
MCH: 29.9 pg (ref 26.0–34.0)
MCHC: 32.8 g/dL (ref 30.0–36.0)
MCV: 91.2 fL (ref 78.0–100.0)
PLATELETS: 352 10*3/uL (ref 150–400)
RBC: 4.21 MIL/uL (ref 3.87–5.11)
RDW: 12.4 % (ref 11.5–15.5)
WBC: 7.5 10*3/uL (ref 4.0–10.5)

## 2017-07-08 LAB — BASIC METABOLIC PANEL
Anion gap: 11 (ref 5–15)
CO2: 26 mmol/L (ref 22–32)
Calcium: 9.1 mg/dL (ref 8.9–10.3)
Chloride: 100 mmol/L — ABNORMAL LOW (ref 101–111)
Creatinine, Ser: 0.62 mg/dL (ref 0.44–1.00)
Glucose, Bld: 93 mg/dL (ref 65–99)
Potassium: 3.6 mmol/L (ref 3.5–5.1)
SODIUM: 137 mmol/L (ref 135–145)

## 2017-07-08 LAB — I-STAT TROPONIN, ED: TROPONIN I, POC: 0 ng/mL (ref 0.00–0.08)

## 2017-07-08 LAB — D-DIMER, QUANTITATIVE (NOT AT ARMC): D DIMER QUANT: 0.38 ug{FEU}/mL (ref 0.00–0.50)

## 2017-07-08 MED ORDER — ACETAMINOPHEN 325 MG PO TABS
650.0000 mg | ORAL_TABLET | Freq: Once | ORAL | Status: AC
  Administered 2017-07-08: 650 mg via ORAL
  Filled 2017-07-08: qty 2

## 2017-07-08 MED ORDER — LEVOFLOXACIN 500 MG PO TABS: 500.0000 mg | ORAL_TABLET | Freq: Every day | ORAL | 0 refills | Status: DC

## 2017-07-08 MED ORDER — LEVOFLOXACIN 500 MG PO TABS
500.0000 mg | ORAL_TABLET | Freq: Once | ORAL | Status: AC
  Administered 2017-07-08: 500 mg via ORAL
  Filled 2017-07-08: qty 1

## 2017-07-08 NOTE — ED Notes (Signed)
Patient transported to X-ray 

## 2017-07-08 NOTE — ED Notes (Signed)
Pt verbalized understanding of discharge instructions. Unable to sign, computer down.

## 2017-07-08 NOTE — ED Notes (Signed)
Pt states some pain in bil legs Friday. Also states long car trip over christmas holiday.

## 2017-07-08 NOTE — ED Notes (Signed)
ED Provider at bedside. 

## 2017-07-08 NOTE — Discharge Instructions (Signed)
You were given a prescription for a 7 day course of Levaquin. Please take this antibiotic to completion. It is very important that you stop taking all of the other antibiotics that you are currently taking while you are on this medication. Follow up with your primary care doctor within 3 days for re-evaluation of your symptoms. Return to the ER for any new or worsening symptoms.

## 2017-07-08 NOTE — ED Provider Notes (Signed)
[facility] EMERGENCY DEPARTMENT Provider Note   CSN: [id number] Arrival date & time: 07/08/17  1213     History   Chief Complaint Chief Complaint  Patient presents with  . Chest Pain    HPI Kelly Fields is a 47 y.o. female.  HPI   48 y/o F with a h/o lyme disease presenting to the ED complaining of left-sided chest pain that radiates to the left shoulder and back. Pain initially started 2 days ago in her neck and jaw. States she felt like she had just "jogged in the cold". Symptoms were not severe so she went to bed and woke up in the AM and felt improved. Throughout the day yesterday sxs returned and she began to have pain again. States pain is 5.5-6/10. It waxes and wanes and is worse with inspiration and movement (going from sitting to standing, sitting to laying). Also reports fever (Tmax 101.27F), but denies URI sxs, cough, SOB, abd pain, NVD, blood in stood, urinary sxs, BLE swelling/redness/pain.  Was seen at [facility] where she received 325 mg ASA and was sent here. Pt reports being on chronic abx regimen for lyme disease. She is followed by a doctor in [place] for this.  Past Medical History:  Diagnosis Date  . Endometriosis   . Migraine   . Tremor     Patient Active Problem List   Diagnosis Date Noted  . 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 Snoring 10/24/2016  . Morbid obesity (HCC) 10/24/2016  . Chronic fatigue 01/20/2016  . Fibromyalgia 01/20/2016  . Migraine   . Dizziness and giddiness 10/15/2012  . Unspecified psychosis 06/07/2012  . Cramp of limb 06/07/2012  . Chest pain, unspecified 06/07/2012  . Disturbance of skin sensation 06/07/2012  . Other malaise and fatigue 06/07/2012  . New daily persistent headache 06/07/2012  . Migraine without aura 06/07/2012  . Essential and other specified forms of tremor 06/07/2012    Past Surgical History:  Procedure Laterality Date  . ABDOMINAL  HYSTERECTOMY    . APPENDECTOMY    . CHOLECYSTECTOMY      OB History    No data available       Home Medications    Prior to Admission medications   Medication Sig Start Date End Date Taking? Authorizing Provider  acetaminophen-codeine (TYLENOL #4) 300-60 MG tablet TAKE 1 TABLET BY MOUTH 3 TIMES A DAY AS NEEDED FOR PAIN 03/26/17  Yes [provider]  cefdinir (OMNICEF) 300 MG capsule TAKE 1 CAPSULE BY MOUTH TWICE A DAY FOR 15 DAYS 02/17/17  Yes [provider]  DULoxetine (CYMBALTA) 60 MG capsule Take 1 capsule (60 mg total) by mouth daily. 07/24/16  Yes Butch Penny, NP  gabapentin (NEURONTIN) 600 MG tablet Take 300-1,200 mg by mouth 4 (four) times daily. 300 mg 3 times daily- 1,200 mg at bedtime 07/20/16  Yes [provider]  ibuprofen (ADVIL,MOTRIN) 200 MG tablet Take 200 mg by mouth every 6 (six) hours as needed for pain.   Yes [provider]  minocycline (DYNACIN) 100 MG tablet TAKE 1 TABLET BY MOUTH TWICE A DAY FOR 15 DAYS 03/03/17  Yes [provider]  naproxen (NAPROSYN) 500 MG tablet Take 500 mg by mouth every 12 (twelve) hours as needed. for pain 04/21/14  Yes [provider]  omeprazole (PRILOSEC) 40 MG capsule Take by mouth daily. 04/16/17  Yes [provider]  pramipexole (MIRAPEX) 0.25 MG tablet TAKE 1/2 TO  1 TABLET BY MOUTH EVERY MORNING AND AT BEDTIME FOR RESTLESS LEG SYNDROME 03/23/17  Yes [provider]  rifampin (RIFADIN) 300 MG capsule Take 300 mg by mouth 2 (two) times daily. 06/27/17  Yes [provider]  SUMAtriptan (IMITREX) 100 MG tablet TAKE 1 TABLET BY MOUTH AS NEEDED FOR MIGRAINE. ONE FOR HEADACHE, NO MORE THAN 3 PER WEEK. 02/21/17  Yes Butch Penny, NP  tiaGABine (GABITRIL) 4 MG tablet Take 4 mg by mouth at bedtime.   Yes [provider]  ALPRAZolam (XANAX) 0.25 MG tablet Take 1 tablet (0.25 mg total) by mouth 3 (three) times daily as needed for anxiety. Patient not taking:  Reported on 07/08/2017 05/11/16   York Spaniel, MD  azithromycin (ZITHROMAX) 500 MG tablet Take 500 mg by mouth daily. Monday -Friday only 02/17/17   [provider]  cefUROXime (CEFTIN) 500 MG tablet TAKE 1 TABLET BY MOUTH TWICE A DAY FOR 15 DAYS 03/23/17   [provider]  compounded topicals builder Apply 1 application topically. 3-4 times daily as directed [facility] for rx: triamciolone acetonide topical aerosol (0.147mg /g)- dispense 400gm.  Sig: apply 2-3 sprays to affected areas 3-4 times daily as directed, with 5 refills.  Fax: [phone]. Received confirmation.    [provider]  levofloxacin (LEVAQUIN) 500 MG tablet Take 1 tablet (500 mg total) by mouth daily. 07/08/17   Palin Tristan S, PA-C  metroNIDAZOLE (FLAGYL) 250 MG tablet TAKE 1 TABLET BY MOUTH 3 TIMES A DAY FRI-SUN 06/27/16   [provider]    Family History Family History  Problem Relation Age of Onset  . Neuropathy Mother   . Restless legs syndrome Mother   . Transient ischemic attack Father     Social History Social History   Tobacco Use  . Smoking status: Never Smoker  . Smokeless tobacco: Never Used  Substance Use Topics  . Alcohol use: No    Alcohol/week: 0.0 oz  . Drug use: No     Allergies   Patient has no known allergies.   Review of Systems Review of Systems  Constitutional: Negative for chills and fever.  HENT: Negative for ear pain and sore throat.   Eyes: Negative for pain and visual disturbance.  Respiratory: Negative for cough, shortness of breath and wheezing.        Right chest pain  Cardiovascular: Negative for chest pain and palpitations.  Gastrointestinal: Negative for abdominal pain and vomiting.  Genitourinary: Negative for dysuria, flank pain, frequency, hematuria and urgency.  Musculoskeletal: Negative for arthralgias and back pain.       Right shoulder pain  Skin: Negative for color change  and rash.  Neurological: Negative for seizures and syncope.  All other systems reviewed and are negative.    Physical Exam Updated Vital Signs BP 103/71   Pulse 77   Temp 97.9 F (36.6 C) (Oral)   Resp 13   SpO2 99%   Physical Exam  Constitutional: She is oriented to person, place, and time. She appears well-developed and well-nourished.  Non-toxic appearance. She does not appear ill.  HENT:  Head: Normocephalic and atraumatic.  No pharyngeal erythema or exudate.  No tonsillar swelling.  Uvula midline and rises symmetrically.  No rhinorrhea noted.  Eyes: Conjunctivae and EOM are normal.  Neck: Normal range of motion. Neck supple.  No meningeal signs. No pain with neck flexion. No swelling, pain, or erythema to neck.  Cardiovascular: Normal rate, regular  rhythm and intact distal pulses.  No murmur heard. Pulmonary/Chest: Effort normal and breath sounds normal. No accessory muscle usage. No tachypnea. No respiratory distress. She has no decreased breath sounds. She has no wheezes. She has no rhonchi. She has no rales.  Abdominal: Soft. Bowel sounds are normal. She exhibits no distension and no mass. There is no tenderness. There is no guarding.  No bilat CVA TTP  Musculoskeletal: She exhibits no edema.  No calf TTP, erythema, swelling/edema.  Lymphadenopathy:    She has no cervical adenopathy.  Neurological: She is alert and oriented to person, place, and time.  Skin: Skin is warm and dry. Capillary refill takes less than 2 seconds. No rash noted. No erythema.  Psychiatric: She has a normal mood and affect.  Nursing note and vitals reviewed.    ED Treatments / Results  Labs (all labs ordered are listed, but only abnormal results are displayed) Labs Reviewed  BASIC METABOLIC PANEL - Abnormal; Notable for the following components:      Result Value   Chloride 100 (*)    BUN <5 (*)    All other components within normal limits  CBC  D-DIMER, QUANTITATIVE (NOT [facility])    I-STAT TROPONIN, ED    EKG  EKG Interpretation  Date/Time:  Sunday July 08 2017 12:14:43 EST Ventricular Rate:  77 PR Interval:    QRS Duration: 90 QT Interval:  375 QTC Calculation: 425 R Axis:   43 Text Interpretation:  Sinus rhythm No significant change was found Confirmed by Azalia Bilisampos, Kevin ([id number]) on 07/08/2017 12:19:09 PM       Radiology Dg Chest 2 View  Result Date: 07/08/2017 CLINICAL DATA:  Chest pain starting on Friday. EXAM: CHEST  2 VIEW COMPARISON:  [facility] FINDINGS: Streaky opacity in the left lower lobe may reflect atelectasis or early bronchopneumonia. There is some subsegmental atelectasis along the right hemidiaphragm. The lungs appear otherwise clear. Mild enlargement of the cardiopericardial silhouette, cardiac index 54%. No specific bony abnormality identified. IMPRESSION: 1. Streaky left lower lobe opacity could reflect atelectasis or early bronchopneumonia. 2. Subsegmental atelectasis along the right hemidiaphragm. 3. Mild enlargement of the cardiopericardial silhouette. Electronically Signed   By: Gaylyn RongWalter  Liebkemann M.D.   On: 07/08/2017 13:39    Procedures Procedures (including critical care time)  Medications Ordered in ED Medications  levofloxacin (LEVAQUIN) tablet 500 mg (500 mg Oral Given 07/08/17 1519)  acetaminophen (TYLENOL) tablet 650 mg (650 mg Oral Given 07/08/17 1519)     Initial Impression / Assessment and Plan / ED Course  I have reviewed the triage vital signs and the nursing notes.  Pertinent labs & imaging results that were available during my care of the patient were reviewed by me and considered in my medical decision making (see chart for details).   Discussed pt case with Dr. Patria Maneampos who agrees with the pts workup. Reviewed CXR with Dr. Patria Maneampos and he agreed to tx for pneumonia. Based on pts history and clinical presentation and stable vital signs, low concern for CHF or pericardial effusion.    Dr. Patria Maneampos evaluated the pt in the ED. We reviewed the CXR and he advised tx with Levaquin. Adivsed that pt do not take any of her other antibiotics Levaquin.  Advised her to follow-up with her primary care doctor within 3 days check resolution of symptoms.  Discussed strict return precautions with the patient.  Rechecked the patient.  She is lying in the bed in no acute  distress. She Is reporting mild chest pain.  Will give pain medicine prior to d/c.  Discussed the results of the labs, EKG, chest x-ray and discussed the plan for discharge with antibiotics.  Further reiterated the importance of stopping all of her other antibiotics while taking Levaquin.  Patient and her mother understand and agree to do so.  Advised primary care follow-up within 3 days.  Strict return precautions given.  All questions answered and patient understands.   Final Clinical Impressions(s) / ED Diagnoses   Final diagnoses:  Community acquired pneumonia, unspecified laterality    47 y/o F with a h/o lyme disease presenting with a 2 day h/o chest pain with radiation to the right shoulder. EKG with no st elevations or depressions. No S1Q3T3. Unchanged from prior. CBC, BMP, Trop, and D-dimer negative. Pt is low risk by wells criteria and PERC negative. CXR with streaky left lower lobe opacity could reflect atelectasis or early bronchopneumonia. Chest pain likely secondary to pleurisy from pneumonia. Will treat as community acquired pneumonia and tx with levaquin. Advised to stop all other abx while taking levaquin. Strict f/u instructions and return precautions given. Stable for discharge.  ED Discharge Orders        Ordered    levofloxacin (LEVAQUIN) 500 MG tablet  Daily     07/08/17 1448       Rayne Du 07/08/17 2156    Azalia Bilis, MD 07/09/17 4098    Azalia Bilis, MD 07/09/17 [phone]

## 2017-07-08 NOTE — ED Triage Notes (Signed)
Pt arrives via [facility] with complaints of CP onset Friday. Pt states pain started in her throat and radiates down to her sternum. Pt went to MD office and was sent here to rule out DVT/PE. VSS with EMS, BP 120/78, HR 80 NSR, 100% RA. 12 Lead unremarkable. 324mg  ASA given at MD office, pt also states fever of 101 Friday night. Denies fevers currently.

## 2017-07-31 ENCOUNTER — Other Ambulatory Visit: Payer: Self-pay | Admitting: Family Medicine

## 2017-07-31 ENCOUNTER — Ambulatory Visit
Admission: RE | Admit: 2017-07-31 | Discharge: 2017-07-31 | Disposition: A | Discharge status: Home / Self Care | Payer: BC Managed Care – PPO | Source: Ambulatory Visit | Attending: Family Medicine | Admitting: Family Medicine

## 2017-07-31 DIAGNOSIS — J189 Pneumonia, unspecified organism: Secondary | ICD-10-CM

## 2017-07-31 DIAGNOSIS — J181 Lobar pneumonia, unspecified organism: Principal | ICD-10-CM

## 2017-08-09 ENCOUNTER — Telehealth: Payer: Self-pay | Admitting: Neurology

## 2017-08-09 NOTE — Telephone Encounter (Signed)
Received a message from [facility] in regards to the pt's Bipap usage.   "This is a patient of Dr. Vickey Hugerohmeier who is non compliant with Bipap. They have not used it since Sept 2018. I cannont obtain re-auth. Pt will need another SS and new order to restart. I have called the patient and left a message for her to call me ."  Pt will have to restart the whole process all over in order to restart her Bipap

## 2017-08-21 ENCOUNTER — Other Ambulatory Visit: Payer: Self-pay | Admitting: Adult Health

## 2017-08-22 ENCOUNTER — Telehealth: Payer: Self-pay | Admitting: *Deleted

## 2017-08-22 NOTE — Telephone Encounter (Signed)
Received refill request from [place]) faxed back to [phone]. Received confirmation. Rx refills included:  "Lidocaine 7%/tetracaine 7% cream-dispense 300gm: Sig: Apply a thin layer  (2-3gm to affected areas 3-4 times daily. Peel off after waiting the required application time as directed (max application time of 60min). Doxepin HCL cream 5%, dispense 180gm, apply 1-2 gm topically, 3x/day to affected area. If feeling drowsy, apply at bed time.  Chlorzoxazone tablet 250mg - dispense 120 tabs: take 1 tablet by mouth 3-4 times daily prn for muscle spasm. Naproxen oral suspension: (125mg /665ml- dispense 946 ml. Take 10-2820ml po BID prn for pain.  Calcipotriene cream 0.005%, dispense 360g, apply2-3 grams to affected are 3-4 times/day Flucinonide cream USP 0.1%, dispense  360gm, apply 2-3 gm to affected area 3-4 times daily.  Kamdoy rx skin emulsion- dispense 240ml. Apply 3-4gm twice daily. Flucinonide cream USP 0.1%- dispense 360gm. Apply 2-3 gm to affected area 3-4 times daily or as directed. "

## 2017-08-23 ENCOUNTER — Ambulatory Visit
Admission: RE | Admit: 2017-08-23 | Discharge: 2017-08-23 | Disposition: A | Discharge status: Home / Self Care | Payer: BC Managed Care – PPO | Source: Ambulatory Visit | Attending: Family Medicine | Admitting: Family Medicine

## 2017-08-23 ENCOUNTER — Encounter: Payer: Self-pay | Admitting: *Deleted

## 2017-08-23 ENCOUNTER — Other Ambulatory Visit: Payer: Self-pay | Admitting: Family Medicine

## 2017-08-23 DIAGNOSIS — J189 Pneumonia, unspecified organism: Secondary | ICD-10-CM

## 2017-08-23 DIAGNOSIS — J181 Lobar pneumonia, unspecified organism: Principal | ICD-10-CM

## 2017-08-24 ENCOUNTER — Telehealth: Payer: Self-pay | Admitting: *Deleted

## 2017-08-24 NOTE — Telephone Encounter (Signed)
Submitted PA doxepin 5% cream on covermymeds. Key: [id number]. Waiting on determination.  "Your information has been submitted to [employer].  If [employer] has not responded to your request within 24 hours, contact [employer] at [phone]."

## 2017-08-25 ENCOUNTER — Other Ambulatory Visit: Payer: Self-pay | Admitting: Adult Health

## 2017-08-27 NOTE — Telephone Encounter (Signed)
I called the patient.  The topical ointment was denied, she will call us back if she has any significant concerns about this, she may get the ointment if she pays out-of-pocket.

## 2017-08-27 NOTE — Telephone Encounter (Signed)
Received fax notification from [employer] that PA denied because: "You do not meet the requirements of your plan. Your prudoxin, zonalon step therapy criteria covers this drug when it is prescribed for the following: short-term (up to 8 days) management of moderate pruritis in an adult patient with atopic dermatitis or lichen simplex chronicus"

## 2017-10-22 ENCOUNTER — Ambulatory Visit: Payer: BC Managed Care – PPO | Admitting: Adult Health

## 2017-11-05 ENCOUNTER — Telehealth: Payer: Self-pay | Admitting: Adult Health

## 2017-11-05 ENCOUNTER — Emergency Department (HOSPITAL_COMMUNITY): Payer: BC Managed Care – PPO

## 2017-11-05 ENCOUNTER — Emergency Department (HOSPITAL_COMMUNITY)
Admission: EM | Admit: 2017-11-05 | Discharge: 2017-11-06 | Disposition: A | Discharge status: Home / Self Care | Payer: BC Managed Care – PPO | Attending: Emergency Medicine | Admitting: Emergency Medicine

## 2017-11-05 ENCOUNTER — Encounter (HOSPITAL_COMMUNITY): Payer: Self-pay | Admitting: Emergency Medicine

## 2017-11-05 DIAGNOSIS — Z79899 Other long term (current) drug therapy: Secondary | ICD-10-CM | POA: Diagnosis not present

## 2017-11-05 DIAGNOSIS — R202 Paresthesia of skin: Secondary | ICD-10-CM

## 2017-11-05 DIAGNOSIS — M79604 Pain in right leg: Secondary | ICD-10-CM | POA: Insufficient documentation

## 2017-11-05 DIAGNOSIS — R51 Headache: Secondary | ICD-10-CM | POA: Insufficient documentation

## 2017-11-05 DIAGNOSIS — R200 Anesthesia of skin: Secondary | ICD-10-CM | POA: Diagnosis present

## 2017-11-05 LAB — DIFFERENTIAL
Basophils Absolute: 0 10*3/uL (ref 0.0–0.1)
Basophils Relative: 0 %
Eosinophils Absolute: 0.2 10*3/uL (ref 0.0–0.7)
Eosinophils Relative: 2 %
LYMPHS ABS: 2.1 10*3/uL (ref 0.7–4.0)
LYMPHS PCT: 25 %
MONO ABS: 0.5 10*3/uL (ref 0.1–1.0)
MONOS PCT: 7 %
NEUTROS ABS: 5.4 10*3/uL (ref 1.7–7.7)
Neutrophils Relative %: 66 %

## 2017-11-05 LAB — COMPREHENSIVE METABOLIC PANEL
ALK PHOS: 105 U/L (ref 38–126)
ALT: 20 U/L (ref 14–54)
AST: 17 U/L (ref 15–41)
Albumin: 4.3 g/dL (ref 3.5–5.0)
Anion gap: 11 (ref 5–15)
BILIRUBIN TOTAL: 0.4 mg/dL (ref 0.3–1.2)
BUN: 7 mg/dL (ref 6–20)
CALCIUM: 9.2 mg/dL (ref 8.9–10.3)
CO2: 25 mmol/L (ref 22–32)
CREATININE: 0.74 mg/dL (ref 0.44–1.00)
Chloride: 105 mmol/L (ref 101–111)
Glucose, Bld: 86 mg/dL (ref 65–99)
Potassium: 3.6 mmol/L (ref 3.5–5.1)
Sodium: 141 mmol/L (ref 135–145)
TOTAL PROTEIN: 7 g/dL (ref 6.5–8.1)

## 2017-11-05 LAB — I-STAT CHEM 8, ED
BUN: 5 mg/dL — AB (ref 6–20)
CALCIUM ION: 1.15 mmol/L (ref 1.15–1.40)
Chloride: 104 mmol/L (ref 101–111)
Creatinine, Ser: 0.6 mg/dL (ref 0.44–1.00)
Glucose, Bld: 80 mg/dL (ref 65–99)
HCT: 43 % (ref 36.0–46.0)
Hemoglobin: 14.6 g/dL (ref 12.0–15.0)
Potassium: 3.5 mmol/L (ref 3.5–5.1)
SODIUM: 141 mmol/L (ref 135–145)
TCO2: 23 mmol/L (ref 22–32)

## 2017-11-05 LAB — CBC
HEMATOCRIT: 42.4 % (ref 36.0–46.0)
Hemoglobin: 14.4 g/dL (ref 12.0–15.0)
MCH: 30.7 pg (ref 26.0–34.0)
MCHC: 34 g/dL (ref 30.0–36.0)
MCV: 90.4 fL (ref 78.0–100.0)
Platelets: 340 10*3/uL (ref 150–400)
RBC: 4.69 MIL/uL (ref 3.87–5.11)
RDW: 13.3 % (ref 11.5–15.5)
WBC: 8.2 10*3/uL (ref 4.0–10.5)

## 2017-11-05 LAB — PROTIME-INR
INR: 0.95
Prothrombin Time: 12.6 seconds (ref 11.4–15.2)

## 2017-11-05 LAB — I-STAT BETA HCG BLOOD, ED (MC, WL, AP ONLY): I-stat hCG, quantitative: 5.7 m[IU]/mL — ABNORMAL HIGH (ref <5)

## 2017-11-05 LAB — APTT: aPTT: 26 seconds (ref 24–36)

## 2017-11-05 LAB — CBG MONITORING, ED: GLUCOSE-CAPILLARY: 75 mg/dL (ref 65–99)

## 2017-11-05 MED ORDER — GADOBENATE DIMEGLUMINE 529 MG/ML IV SOLN
20.0000 mL | Freq: Once | INTRAVENOUS | Status: AC
  Administered 2017-11-05: 20 mL via INTRAVENOUS

## 2017-11-05 MED ORDER — HYDROCODONE-ACETAMINOPHEN 5-325 MG PO TABS
1.0000 | ORAL_TABLET | Freq: Once | ORAL | Status: AC
  Administered 2017-11-05: 1 via ORAL
  Filled 2017-11-05: qty 1

## 2017-11-05 NOTE — Telephone Encounter (Signed)
After discussing with NP., spoke with patient and advised her that if her symptoms are brand new, Megan's recommendation is that she go directly to the ED to be evaluated. The patient verbalized understanding, stated "then that's what I'll do". She verbalized appreciation for call back.

## 2017-11-05 NOTE — ED Notes (Signed)
Patient ambulatory to bathroom with steady gait at this time 

## 2017-11-05 NOTE — ED Provider Notes (Signed)
[facility] EMERGENCY DEPARTMENT Provider Note   CSN: [id number] Arrival date & time: 11/05/17  1242     History   Chief Complaint Chief Complaint  Patient presents with  . Numbness    right    HPI Kelly Fields is a 47 y.o. female.  47 year old female with reported prior history of chronic Lyme infection, endometriosis, migraines, and chronic fatigue presents with complaint of numbness and tingling.  Patient reports that 4 days previously she noted numbness and tingling of the right ear, the right arm, and the right leg.  Symptoms are isolated to the specific areas.  She denies any associated right facial numbness.  She denies any focal weakness.  She denies any associated headache or fever.  She denies visual symptoms.  She appears to be comfortable at the time of my exam.  Symptoms are persistent and have not improved nor worsened since onset 4 days ago.  The history is provided by the patient.  Neurologic Problem  This is a new problem. The current episode started more than 2 days ago. The problem occurs constantly. The problem has not changed since onset.Pertinent negatives include no chest pain and no abdominal pain. Nothing aggravates the symptoms. Nothing relieves the symptoms. She has tried nothing for the symptoms. The treatment provided no relief.    Past Medical History:  Diagnosis Date  . Endometriosis   . Migraine   . Tremor     Patient Active Problem List   Diagnosis Date Noted  . 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 Snoring 10/24/2016  . Morbid obesity (HCC) 10/24/2016  . Chronic fatigue 01/20/2016  . Fibromyalgia 01/20/2016  . Migraine   . Dizziness and giddiness 10/15/2012  . Unspecified psychosis 06/07/2012  . Cramp of limb 06/07/2012  . Chest pain, unspecified 06/07/2012  . Disturbance of skin sensation 06/07/2012  . Other malaise and fatigue 06/07/2012  . New daily persistent headache  06/07/2012  . Migraine without aura 06/07/2012  . Essential and other specified forms of tremor 06/07/2012    Past Surgical History:  Procedure Laterality Date  . ABDOMINAL HYSTERECTOMY    . APPENDECTOMY    . CHOLECYSTECTOMY       OB History   None      Home Medications    Prior to Admission medications   Medication Sig Start Date End Date Taking? Authorizing Provider  acetaminophen-codeine (TYLENOL #4) 300-60 MG tablet TAKE 1 TABLET BY MOUTH 3 TIMES A DAY AS NEEDED FOR PAIN 03/26/17  Yes [provider]  azithromycin (ZITHROMAX) 500 MG tablet Take 500 mg by mouth daily. Monday -Friday only 02/17/17  Yes [provider]  cefdinir (OMNICEF) 300 MG capsule TAKE 1 CAPSULE BY MOUTH TWICE A DAY FOR 15 DAYS 02/17/17  Yes [provider]  cefUROXime (CEFTIN) 500 MG tablet TAKE 1 TABLET BY MOUTH TWICE A DAY FOR 15 DAYS 03/23/17  Yes [provider]  DULoxetine (CYMBALTA) 60 MG capsule TAKE 1 CAPSULE (60 MG TOTAL) BY MOUTH DAILY. 08/21/17  Yes York Spaniel, MD  gabapentin (NEURONTIN) 600 MG tablet Take 300-1,200 mg by mouth 4 (four) times daily. 300 mg 3 times daily- 1,200 mg at bedtime 07/20/16  Yes [provider]  ibuprofen (ADVIL,MOTRIN) 200 MG tablet Take 200 mg by mouth every 6 (six) hours as needed for pain.   Yes [provider]  metroNIDAZOLE (FLAGYL) 250 MG tablet TAKE 1 TABLET BY MOUTH 3 TIMES A DAY FRI-SUN  06/27/16  Yes [provider]  minocycline (DYNACIN) 100 MG tablet TAKE 1 TABLET BY MOUTH TWICE A DAY FOR 15 DAYS 03/03/17  Yes [provider]  omeprazole (PRILOSEC) 40 MG capsule Take 40 mg by mouth daily.  04/16/17  Yes [provider]  rifampin (RIFADIN) 300 MG capsule Take 300 mg by mouth 3 (three) times daily.  06/27/17  Yes [provider]  SUMAtriptan (IMITREX) 100 MG tablet TAKE 1 TABLET BY MOUTH AS NEEDED FOR MIGRAINE. ONE FOR HEADACHE, NO MORE THAN 3 PER WEEK. 08/27/17  Yes York Spaniel, MD  ALPRAZolam Prudy Feeler) 0.25 MG tablet Take 1 tablet (0.25 mg total) by mouth 3 (three) times daily as needed for anxiety. Patient not taking: Reported on 07/08/2017 05/11/16   York Spaniel, MD    Family History Family History  Problem Relation Age of Onset  . Neuropathy Mother   . Restless legs syndrome Mother   . Transient ischemic attack Father     Social History Social History   Tobacco Use  . Smoking status: Never Smoker  . Smokeless tobacco: Never Used  Substance Use Topics  . Alcohol use: No    Alcohol/week: 0.0 oz  . Drug use: No     Allergies   Patient has no known allergies.   Review of Systems Review of Systems  Cardiovascular: Negative for chest pain.  Gastrointestinal: Negative for abdominal pain.  Neurological: Positive for numbness.  All other systems reviewed and are negative.    Physical Exam Updated Vital Signs BP 112/66   Pulse 85   Temp 98.4 F (36.9 C)   Resp 16   SpO2 100%   Physical Exam  Constitutional: She is oriented to person, place, and time. She appears well-developed and well-nourished. No distress.  HENT:  Head: Normocephalic and atraumatic.  Mouth/Throat: Oropharynx is clear and moist.  Eyes: Pupils are equal, round, and reactive to light. Conjunctivae and EOM are normal.  Neck: Normal range of motion. Neck supple.  Cardiovascular: Normal rate, regular rhythm and normal heart sounds.  Pulmonary/Chest: Effort normal and breath sounds normal. No respiratory distress.  Abdominal: Soft. She exhibits no distension. There is no tenderness.  Musculoskeletal: Normal range of motion. She exhibits no edema or deformity.  Neurological: She is alert and oriented to person, place, and time. No cranial nerve deficit. She exhibits normal muscle tone. Coordination normal.  Alert and oriented No facial droop Normal speech 5 out of 5 strength in all 4 extremity VAN negative   Skin: Skin is warm and dry.  Psychiatric: She  has a normal mood and affect.  Nursing note and vitals reviewed.    ED Treatments / Results  Labs (all labs ordered are listed, but only abnormal results are displayed) Labs Reviewed  I-STAT CHEM 8, ED - Abnormal; Notable for the following components:      Result Value   BUN 5 (*)    All other components within normal limits  I-STAT BETA HCG BLOOD, ED ([facility], [facility], [facility] ONLY) - Abnormal; Notable for the following components:   I-stat hCG, quantitative 5.7 (*)    All other components within normal limits  PROTIME-INR  APTT  CBC  DIFFERENTIAL  COMPREHENSIVE METABOLIC PANEL  I-STAT TROPONIN, ED  CBG MONITORING, ED    EKG None  Radiology Ct Head Wo Contrast  Result Date: 11/05/2017 CLINICAL DATA:  Right-sided intermittent pain and numbness for the past 5 days. EXAM: CT HEAD WITHOUT CONTRAST TECHNIQUE: Contiguous axial images  were obtained from the base of the skull through the vertex without intravenous contrast. COMPARISON:  MR brain dated Nov 03, 2015. FINDINGS: Brain: No evidence of acute infarction, hemorrhage, hydrocephalus, extra-axial collection or mass lesion/mass effect. Vascular: No hyperdense vessel or unexpected calcification. Skull: Normal. Negative for fracture or focal lesion. Sinuses/Orbits: No acute finding. Other: None. IMPRESSION: 1. Normal noncontrast head CT. Electronically Signed   By: Obie Dredge M.D.   On: 11/05/2017 14:04    Procedures Procedures (including critical care time)  Medications Ordered in ED Medications  HYDROcodone-acetaminophen (NORCO/VICODIN) 5-325 MG per tablet 1 tablet (1 tablet Oral Given 11/05/17 2106)     Initial Impression / Assessment and Plan / ED Course  I have reviewed the triage vital signs and the nursing notes.  Pertinent labs & imaging results that were available during my care of the patient were reviewed by me and considered in my medical decision making (see chart for details).      MDM  Screen complete  1930 Case  discussed with neurology on-call Dr. Amada Jupiter -he suggests obtaining an MRI of the brain and C-spine while in the ED.  He does not feel that the patient's symptoms are entirely suggestive of a CVA.  He feels that if the MRI studies are negative then the patient could be safely discharged home.  Patient is presenting for evaluation of numbness of the right ear, right arm, and right leg. No focal weakness noted. CT head is without acute abnormalities. Screening labs are within normal limits.   MRI Brain/Cspine ordered and results are pending. Patient understands plan of care.   [facility] aware of pending MRI results and Dispo.    Final Clinical Impressions(s) / ED Diagnoses   Final diagnoses:  Paresthesia    ED Discharge Orders    None       Wynetta Fines, MD 11/05/17 2212

## 2017-11-05 NOTE — ED Triage Notes (Addendum)
Pt states Thursday she woke up with right arm and right leg numbness and tingling. Pt also has this feeling to right ear, it has been constant and getting worse. Alert and oriented. She also has pain to the arm and leg 5/10. No chest pain. No shortness of breath. Pt also has pain to right neck.

## 2017-11-05 NOTE — ED Notes (Signed)
Patient transported to MRI 

## 2017-11-05 NOTE — Telephone Encounter (Signed)
Spoke with patient who stated symptoms began Thurs when she would lie down. She woke in the middle of the night with numbness of her right arm and leg. She stated her right arm/leg, ear feel funny, and she can't hold things well. Her neck catches and "doesn't want to look down" and she stated the numbness in right arm and leg are "pretty constant right now" . There is no history of recent injury, and this is a new problems for her. This RN stated she most likely will need to call PCP for emergent visit or go to ED. Advised will discuss with NP and call her back. She verbalized understanding, appreciation.

## 2017-11-05 NOTE — Telephone Encounter (Signed)
Pt's mother called said she rec'd a text this morning from the pt (she is at work) that she is experiencing left sided shaking and numb. Pt's said she mentioned a few days ago she was having numbness. Pt's mother thinks the pt is should be seen today and declined an appt tomorrow. She has asked RN to call the pt at [phone], she will be waiting for the call and is aware it can take up to 2 hours for a return call.

## 2017-11-06 NOTE — ED Provider Notes (Signed)
Care assumed from  Dr. Rodena Medin at shift change with MRI pending. Please see his notes for further details.   In brief, this patient is a 47 year old female with a history of chronic Lyme's disease, chronic fatigue syndrome, migraines who presents the emergency department today for 4-day history of numbness and tingling distributed to the right ear, right arm and right leg.  Patient had reassuring lab work.  Case discussed with neurology who recommended MRI and if reassuring patient could go home.  Gen: afebrile, VSS HEENT: Atraumatic, EOMI Resp: no resp distress CV: RRR Abd: soft, NT, ND MsK: moving all extremities well Neuro: A&O x4.  Upper and lower extremity strength equal and symmetric bilaterally  PLAN: If MRI negative patient can go home.  Results for orders placed or performed during the hospital encounter of 11/05/17  Protime-INR  Result Value Ref Range   Prothrombin Time 12.6 11.4 - 15.2 seconds   INR 0.95   APTT  Result Value Ref Range   aPTT 26 24 - 36 seconds  CBC  Result Value Ref Range   WBC 8.2 4.0 - 10.5 K/uL   RBC 4.69 3.87 - 5.11 MIL/uL   Hemoglobin 14.4 12.0 - 15.0 g/dL   HCT 16.1 09.6 - 04.5 %   MCV 90.4 78.0 - 100.0 fL   MCH 30.7 26.0 - 34.0 pg   MCHC 34.0 30.0 - 36.0 g/dL   RDW 40.9 81.1 - 91.4 %   Platelets 340 150 - 400 K/uL  Differential  Result Value Ref Range   Neutrophils Relative % 66 %   Neutro Abs 5.4 1.7 - 7.7 K/uL   Lymphocytes Relative 25 %   Lymphs Abs 2.1 0.7 - 4.0 K/uL   Monocytes Relative 7 %   Monocytes Absolute 0.5 0.1 - 1.0 K/uL   Eosinophils Relative 2 %   Eosinophils Absolute 0.2 0.0 - 0.7 K/uL   Basophils Relative 0 %   Basophils Absolute 0.0 0.0 - 0.1 K/uL  Comprehensive metabolic panel  Result Value Ref Range   Sodium 141 135 - 145 mmol/L   Potassium 3.6 3.5 - 5.1 mmol/L   Chloride 105 101 - 111 mmol/L   CO2 25 22 - 32 mmol/L   Glucose, Bld 86 65 - 99 mg/dL   BUN 7 6 - 20 mg/dL   Creatinine, Ser 7.82 0.44 - 1.00 mg/dL    Calcium 9.2 8.9 - 95.6 mg/dL   Total Protein 7.0 6.5 - 8.1 g/dL   Albumin 4.3 3.5 - 5.0 g/dL   AST 17 15 - 41 U/L   ALT 20 14 - 54 U/L   Alkaline Phosphatase 105 38 - 126 U/L   Total Bilirubin 0.4 0.3 - 1.2 mg/dL   GFR calc non Af Amer >60 >60 mL/min   GFR calc Af Amer >60 >60 mL/min   Anion gap 11 5 - 15  CBG monitoring, ED  Result Value Ref Range   Glucose-Capillary 75 65 - 99 mg/dL  I-Stat Chem 8, ED  Result Value Ref Range   Sodium 141 135 - 145 mmol/L   Potassium 3.5 3.5 - 5.1 mmol/L   Chloride 104 101 - 111 mmol/L   BUN 5 (L) 6 - 20 mg/dL   Creatinine, Ser 2.13 0.44 - 1.00 mg/dL   Glucose, Bld 80 65 - 99 mg/dL   Calcium, Ion 0.86 5.78 - 1.40 mmol/L   TCO2 23 22 - 32 mmol/L   Hemoglobin 14.6 12.0 - 15.0 g/dL   HCT 43.0  36.0 - 46.0 %  I-Stat beta hCG blood, ED  Result Value Ref Range   I-stat hCG, quantitative 5.7 (H) <5 mIU/mL   Comment 3           Ct Head Wo Contrast  Result Date: 11/05/2017 CLINICAL DATA:  Right-sided intermittent pain and numbness for the past 5 days. EXAM: CT HEAD WITHOUT CONTRAST TECHNIQUE: Contiguous axial images were obtained from the base of the skull through the vertex without intravenous contrast. COMPARISON:  MR brain dated Nov 03, 2015. FINDINGS: Brain: No evidence of acute infarction, hemorrhage, hydrocephalus, extra-axial collection or mass lesion/mass effect. Vascular: No hyperdense vessel or unexpected calcification. Skull: Normal. Negative for fracture or focal lesion. Sinuses/Orbits: No acute finding. Other: None. IMPRESSION: 1. Normal noncontrast head CT. Electronically Signed   By: Obie Dredge M.D.   On: 11/05/2017 14:04   Mr Brain W And Wo Contrast  Result Date: 11/05/2017 CLINICAL DATA:  Right arm and leg numbness and tingling. EXAM: MRI HEAD WITHOUT AND WITH CONTRAST MRI CERVICAL SPINE WITHOUT AND WITH CONTRAST TECHNIQUE: Multiplanar, multiecho pulse sequences of the brain and surrounding structures, and cervical spine, to  include the craniocervical junction and cervicothoracic junction, were obtained without and with intravenous contrast. CONTRAST:  20mL MULTIHANCE GADOBENATE DIMEGLUMINE 529 MG/ML IV SOLN COMPARISON:  Head CT 11/05/2017 FINDINGS: MRI HEAD FINDINGS BRAIN: The midline structures are normal. There is no acute infarct or acute hemorrhage. No mass lesion, hydrocephalus, dural abnormality or extra-axial collection. Minimal white matter hyperintensity, nonspecific and commonly seen in asymptomatic patients of this age. No age-advanced or lobar predominant atrophy. No chronic microhemorrhage or superficial siderosis. VASCULAR: Major intracranial arterial and venous sinus flow voids are preserved. SKULL AND UPPER CERVICAL SPINE: The visualized skull base, calvarium, upper cervical spine and extracranial soft tissues are normal. SINUSES/ORBITS: No fluid levels or advanced mucosal thickening. No mastoid or middle ear effusion. Normal orbits. MRI CERVICAL SPINE FINDINGS Alignment: Normal. Vertebrae: No focal marrow lesion. No compression fracture or evidence of discitis osteomyelitis. Cord: Normal caliber and signal. Posterior Fossa, vertebral arteries, paraspinal tissues: Visualized posterior fossa is normal. Vertebral artery flow voids are preserved. No prevertebral effusion. Disc levels: The C1-2 articulation is normal. The C2-C4 levels are normal. C4-C5: Mild left uncovertebral hypertrophy without stenosis. C5-C6 medium-sized left subarticular/foraminal disc osteophyte complex with moderate-to-severe left neural foraminal stenosis. No spinal canal stenosis. C6-C7: Minimal disc bulge.  No stenosis. C7-T1: Normal. T1-T2 is imaged in the sagittal plane only, with no visible disc herniation or stenosis. No abnormal contrast enhancement. IMPRESSION: 1. Normal MRI of the brain. 2. C5-C6 left subarticular/foraminal medium-sized disc osteophyte complex causing moderate-to-severe stenosis of the left neural foramen. 3. No findings to  account for the reported right-sided symptoms. No spinal cord lesion. Electronically Signed   By: Deatra Robinson M.D.   On: 11/05/2017 22:31   Mr Cervical Spine W Or Wo Contrast  Result Date: 11/05/2017 CLINICAL DATA:  Right arm and leg numbness and tingling. EXAM: MRI HEAD WITHOUT AND WITH CONTRAST MRI CERVICAL SPINE WITHOUT AND WITH CONTRAST TECHNIQUE: Multiplanar, multiecho pulse sequences of the brain and surrounding structures, and cervical spine, to include the craniocervical junction and cervicothoracic junction, were obtained without and with intravenous contrast. CONTRAST:  20mL MULTIHANCE GADOBENATE DIMEGLUMINE 529 MG/ML IV SOLN COMPARISON:  Head CT 11/05/2017 FINDINGS: MRI HEAD FINDINGS BRAIN: The midline structures are normal. There is no acute infarct or acute hemorrhage. No mass lesion, hydrocephalus, dural abnormality or extra-axial collection. Minimal white matter hyperintensity, nonspecific and  commonly seen in asymptomatic patients of this age. No age-advanced or lobar predominant atrophy. No chronic microhemorrhage or superficial siderosis. VASCULAR: Major intracranial arterial and venous sinus flow voids are preserved. SKULL AND UPPER CERVICAL SPINE: The visualized skull base, calvarium, upper cervical spine and extracranial soft tissues are normal. SINUSES/ORBITS: No fluid levels or advanced mucosal thickening. No mastoid or middle ear effusion. Normal orbits. MRI CERVICAL SPINE FINDINGS Alignment: Normal. Vertebrae: No focal marrow lesion. No compression fracture or evidence of discitis osteomyelitis. Cord: Normal caliber and signal. Posterior Fossa, vertebral arteries, paraspinal tissues: Visualized posterior fossa is normal. Vertebral artery flow voids are preserved. No prevertebral effusion. Disc levels: The C1-2 articulation is normal. The C2-C4 levels are normal. C4-C5: Mild left uncovertebral hypertrophy without stenosis. C5-C6 medium-sized left subarticular/foraminal disc osteophyte  complex with moderate-to-severe left neural foraminal stenosis. No spinal canal stenosis. C6-C7: Minimal disc bulge.  No stenosis. C7-T1: Normal. T1-T2 is imaged in the sagittal plane only, with no visible disc herniation or stenosis. No abnormal contrast enhancement. IMPRESSION: 1. Normal MRI of the brain. 2. C5-C6 left subarticular/foraminal medium-sized disc osteophyte complex causing moderate-to-severe stenosis of the left neural foramen. 3. No findings to account for the reported right-sided symptoms. No spinal cord lesion. Electronically Signed   By: Deatra Robinson M.D.   On: 11/05/2017 22:31    MDM:  47 year old female with 4-day history of numbness and tingling distributed to the right ear, right arm and right leg.  Patient had reassuring lab work.  Case discussed with neurology who recommended MRI and if reassuring patient could go home.  She with reassuring MRIs.  Normal brain MRI.  There is a C5-6 left canal stenosis but this would not account for the patient's right-sided symptoms.  No findings of the cervical spine that would account for patient's right-sided symptoms. Patient reevaluated and discussed results. I advised the patient to follow-up with PCP this week. Referral to neurology given. Specific return precautions discussed. Time was given for all questions to be answered. The patient verbalized understanding and agreement with plan. The patient appears safe for discharge home.  1. Paresthesia       Princella Pellegrini 11/06/17 Remus Loffler, MD 11/07/17 [phone]

## 2017-11-06 NOTE — Discharge Instructions (Signed)
Your blood work and MRI's were reassuring.  Please follow up with your PCP and Neurology.  If you develop any weakness on one side your body, visual changes, difficulty with hearing, tripping on one side of her face, difficulty with speech, or difficulty with gait please return to the emergency department. If you develop worsening or new concerning symptoms you can return to the emergency department for re-evaluation.

## 2017-11-29 ENCOUNTER — Ambulatory Visit: Payer: BC Managed Care – PPO | Admitting: Adult Health

## 2018-02-19 ENCOUNTER — Other Ambulatory Visit: Payer: Self-pay | Admitting: Family Medicine

## 2018-02-19 ENCOUNTER — Ambulatory Visit
Admission: RE | Admit: 2018-02-19 | Discharge: 2018-02-19 | Disposition: A | Discharge status: Home / Self Care | Payer: BC Managed Care – PPO | Source: Ambulatory Visit | Attending: Family Medicine | Admitting: Family Medicine

## 2018-02-19 DIAGNOSIS — M542 Cervicalgia: Secondary | ICD-10-CM

## 2019-12-19 IMAGING — CR DG CHEST 2V
2 series · 2 of 2 positions shown · non-contrast
Comparison: 07/08/2017 examination from [REDACTED]

CLINICAL DATA: Chest pain starting on [REDACTED].

EXAM:
CHEST  2 VIEW

[chest pa]
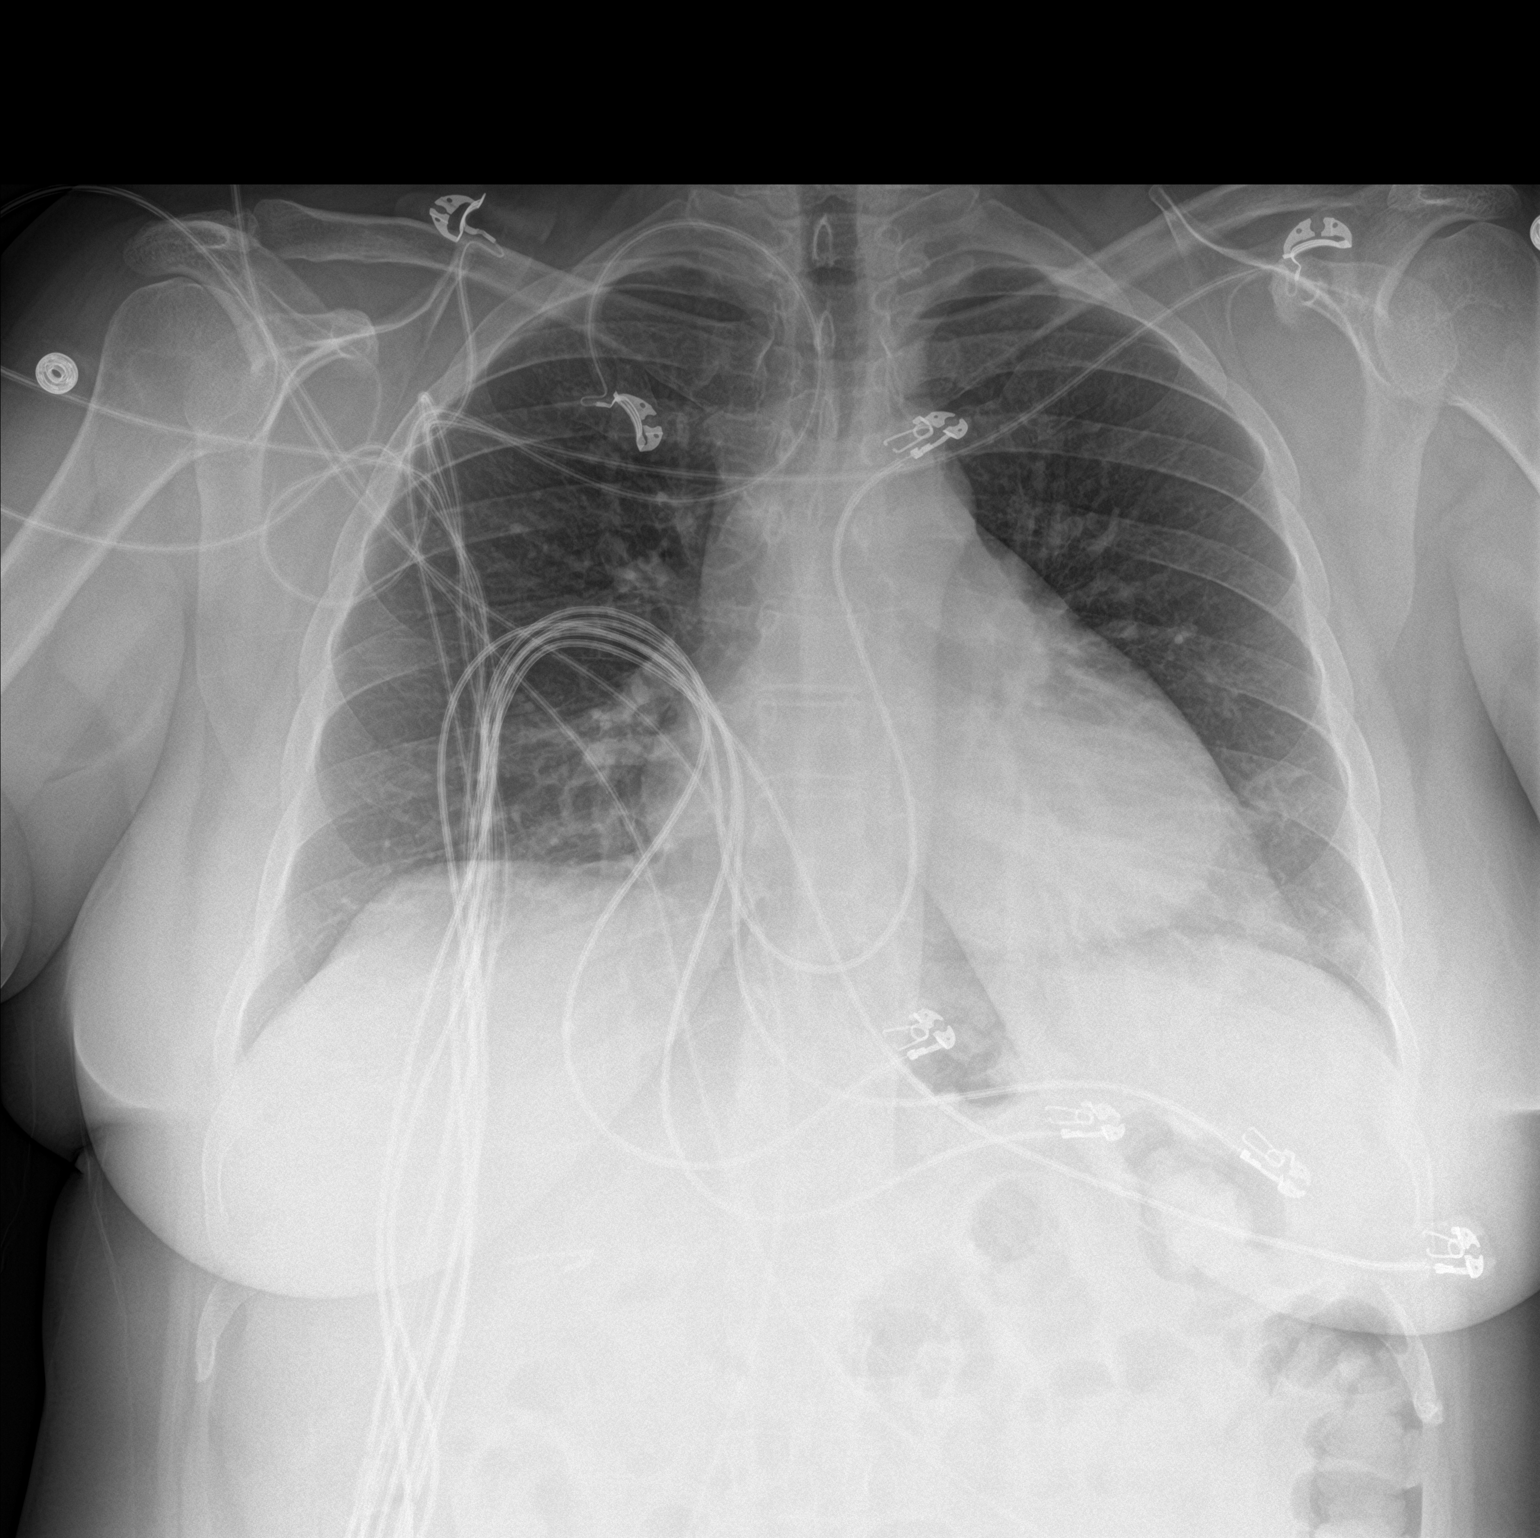

[chest lat]
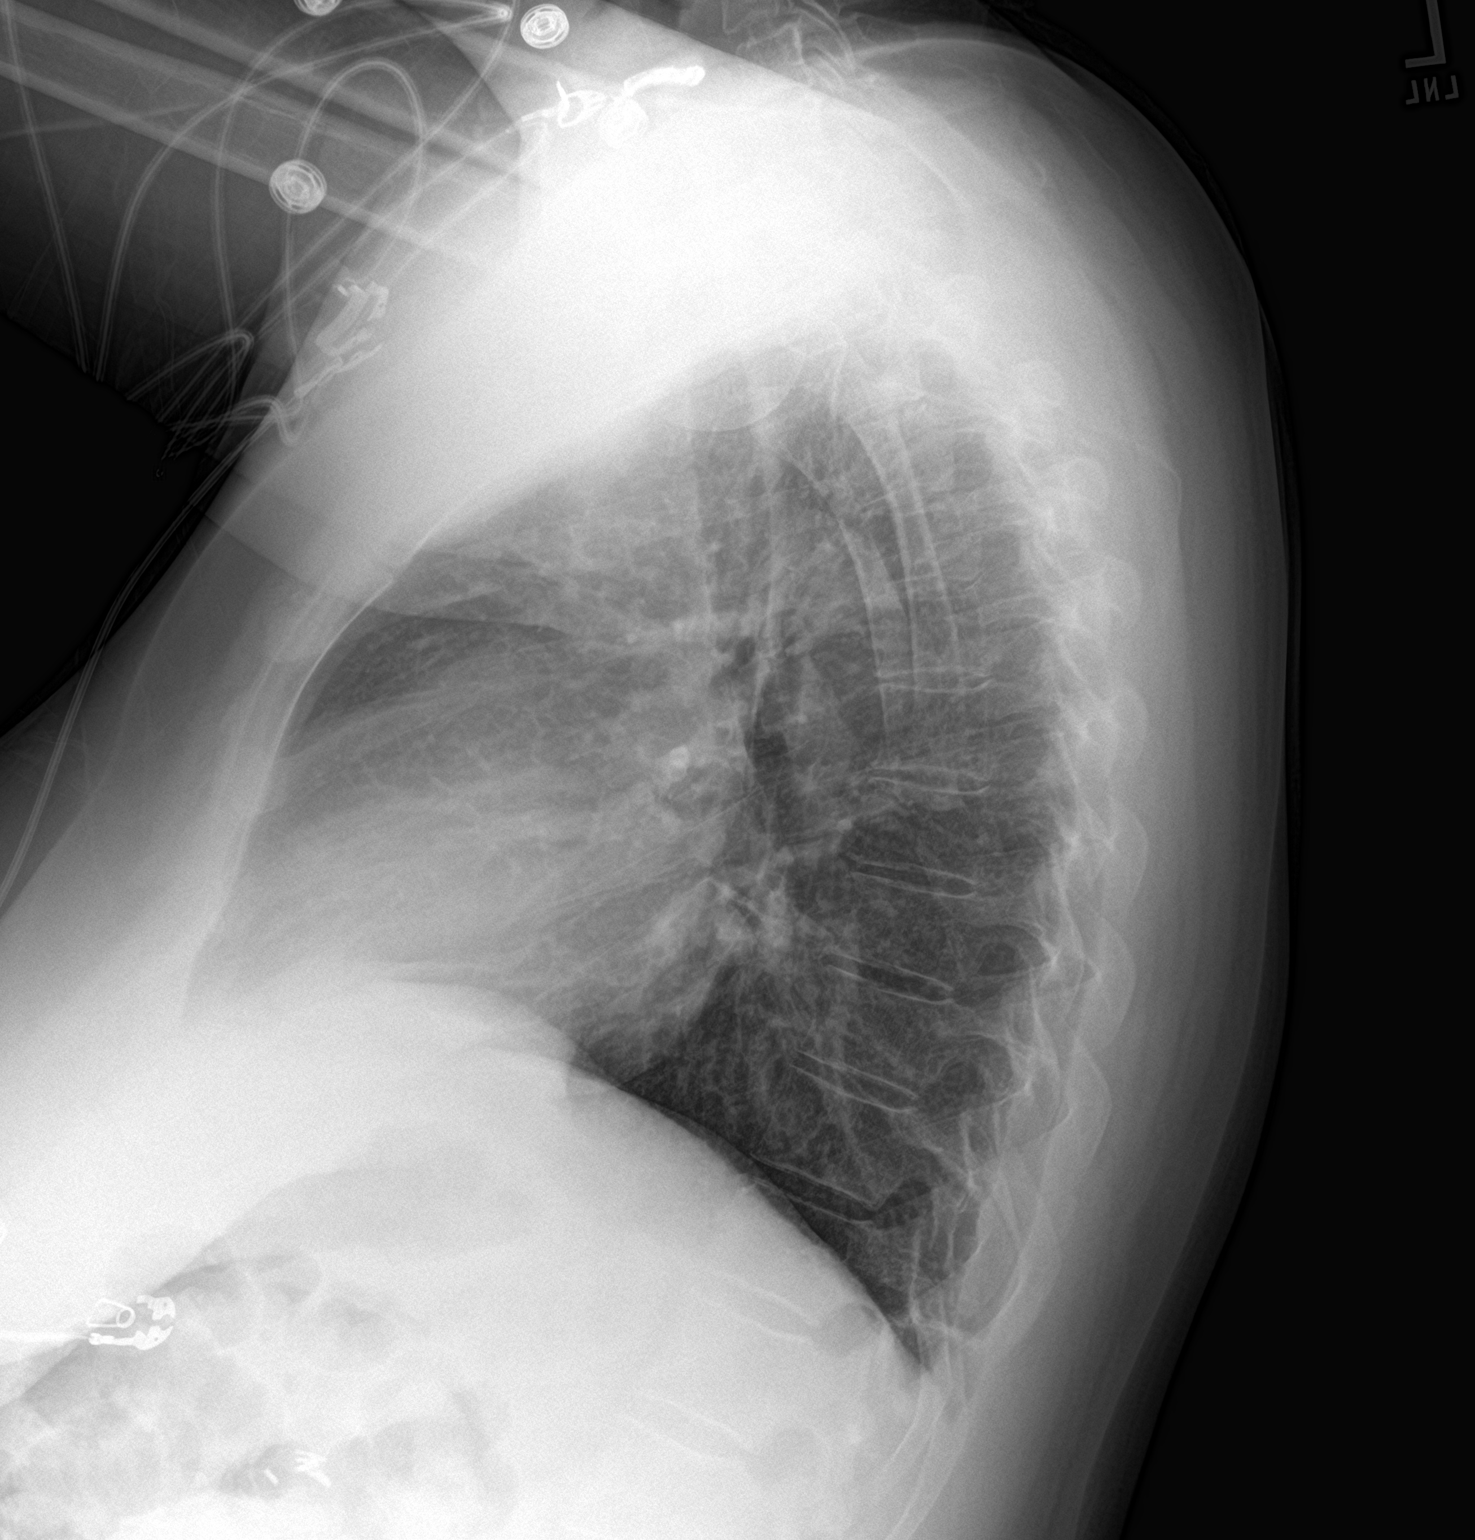

[2 of 2 positions shown; findings below may reference images not displayed]

FINDINGS: Streaky opacity in the left lower lobe may reflect atelectasis or
early bronchopneumonia. There is some subsegmental atelectasis along
the right hemidiaphragm. The lungs appear otherwise clear.

Mild enlargement of the cardiopericardial silhouette, cardiac index
54%. No specific bony abnormality identified.
IMPRESSION: 1. Streaky left lower lobe opacity could reflect atelectasis or
early bronchopneumonia.
2. Subsegmental atelectasis along the right hemidiaphragm.
3. Mild enlargement of the cardiopericardial silhouette.

## 2020-04-06 ENCOUNTER — Other Ambulatory Visit: Payer: Self-pay

## 2020-04-06 ENCOUNTER — Emergency Department (HOSPITAL_COMMUNITY)
Admission: EM | Admit: 2020-04-06 | Discharge: 2020-04-06 | Disposition: A | Discharge status: Home / Self Care | Payer: BC Managed Care – PPO | Attending: Emergency Medicine | Admitting: Emergency Medicine

## 2020-04-06 ENCOUNTER — Emergency Department (HOSPITAL_COMMUNITY): Payer: BC Managed Care – PPO

## 2020-04-06 ENCOUNTER — Encounter (HOSPITAL_COMMUNITY): Payer: Self-pay | Admitting: Emergency Medicine

## 2020-04-06 DIAGNOSIS — R0789 Other chest pain: Secondary | ICD-10-CM | POA: Diagnosis present

## 2020-04-06 DIAGNOSIS — R072 Precordial pain: Secondary | ICD-10-CM | POA: Diagnosis not present

## 2020-04-06 LAB — BASIC METABOLIC PANEL
Anion gap: 10 (ref 5–15)
BUN: 5 mg/dL — ABNORMAL LOW (ref 6–20)
CO2: 25 mmol/L (ref 22–32)
Calcium: 9 mg/dL (ref 8.9–10.3)
Chloride: 105 mmol/L (ref 98–111)
Creatinine, Ser: 0.68 mg/dL (ref 0.44–1.00)
GFR, Estimated: >60 mL/min (ref >60)
Glucose, Bld: 84 mg/dL (ref 70–99)
Potassium: 3.5 mmol/L (ref 3.5–5.1)
Sodium: 140 mmol/L (ref 135–145)

## 2020-04-06 LAB — CBC
HCT: 38 % (ref 36.0–46.0)
Hemoglobin: 12.6 g/dL (ref 12.0–15.0)
MCH: 30.7 pg (ref 26.0–34.0)
MCHC: 33.2 g/dL (ref 30.0–36.0)
MCV: 92.5 fL (ref 80.0–100.0)
Platelets: 361 10*3/uL (ref 150–400)
RBC: 4.11 MIL/uL (ref 3.87–5.11)
RDW: 12.8 % (ref 11.5–15.5)
WBC: 6.6 10*3/uL (ref 4.0–10.5)
nRBC: 0 % (ref 0.0–0.2)

## 2020-04-06 LAB — HEPATIC FUNCTION PANEL
ALT: 36 U/L (ref 0–44)
AST: 23 U/L (ref 15–41)
Albumin: 4.2 g/dL (ref 3.5–5.0)
Alkaline Phosphatase: 101 U/L (ref 38–126)
Bilirubin, Direct: <0.1 mg/dL (ref 0.0–0.2)
Total Bilirubin: 0.8 mg/dL (ref 0.3–1.2)
Total Protein: 7.1 g/dL (ref 6.5–8.1)

## 2020-04-06 LAB — TROPONIN I (HIGH SENSITIVITY)
Troponin I (High Sensitivity): 3 ng/L (ref <18)
Troponin I (High Sensitivity): 5 ng/L (ref <18)

## 2020-04-06 LAB — LIPASE, BLOOD: Lipase: 31 U/L (ref 11–51)

## 2020-04-06 NOTE — ED Provider Notes (Signed)
[facility]   CSN: [id number] Arrival date & time: 04/06/20  1047     History Chief Complaint  Patient presents with  . Chest Pain    Kelly Fields is a 49 y.o. female.  Patient with history of cholecystectomy, appendectomy, abdominal hysterectomy --presents to the emergency department for evaluation of chest pressure started this morning while sitting in her car.  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  She was then referred to the emergency department.  Patient states that she had previous similar episodes related to her gallbladder, which improved when she had this removed.  She denies nausea or vomiting, diaphoresis.  The pain radiated to her back.  She denies shortness of breath or difficulty with exertion or activity.  She was able to go to work today without feeling short of breath.  No treatments prior to arrival.  She denies distinct abdominal pain, urinary symptoms, constipation or diarrhea.  She denies history of hypertension, hyperlipidemia, diabetes, smoking, family history of coronary artery disease.  She states that she had pain in her left lower leg yesterday, now resolved.  No swelling.  Patient denies risk factors for pulmonary embolism including: unilateral leg swelling, history of DVT/PE/other blood clots, use of exogenous hormones, recent immobilizations, recent surgery, recent travel (>4hr segment), malignancy, hemoptysis.         Past Medical History:  Diagnosis Date  . Endometriosis   . Migraine   . Tremor     Patient Active Problem List   Diagnosis Date Noted  . 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 Snoring 10/24/2016  . Morbid obesity (HCC) 10/24/2016  . Chronic fatigue 01/20/2016  . Fibromyalgia 01/20/2016  . Migraine   . Dizziness and giddiness 10/15/2012  . Unspecified psychosis 06/07/2012  . Cramp  of limb 06/07/2012  . Chest pain, unspecified 06/07/2012  . Disturbance of skin sensation 06/07/2012  . Other malaise and fatigue 06/07/2012  . New daily persistent headache 06/07/2012  . Migraine without aura 06/07/2012  . Essential and other specified forms of tremor 06/07/2012    Past Surgical History:  Procedure Laterality Date  . ABDOMINAL HYSTERECTOMY    . APPENDECTOMY    . CHOLECYSTECTOMY       OB History   No obstetric history on file.     Family History  Problem Relation Age of Onset  . Neuropathy Mother   . Restless legs syndrome Mother   . Transient ischemic attack Father     Social History   Tobacco Use  . Smoking status: Never Smoker  . Smokeless tobacco: Never Used  Substance Use Topics  . Alcohol use: No    Alcohol/week: 0.0 standard drinks  . Drug use: No    Home Medications Prior to Admission medications   Medication Sig Start Date End Date Taking? Authorizing Provider  acetaminophen-codeine (TYLENOL #4) 300-60 MG tablet TAKE 1 TABLET BY MOUTH 3 TIMES A DAY AS NEEDED FOR PAIN 03/26/17   [provider]  ALPRAZolam (XANAX) 0.25 MG tablet Take 1 tablet (0.25 mg total) by mouth 3 (three) times daily as needed for anxiety. Patient not taking: Reported on 07/08/2017 05/11/16   York SpanielWillis, Charles K, MD  azithromycin (ZITHROMAX) 500 MG tablet Take 500 mg by mouth daily. Monday -Friday only 02/17/17   [provider]  cefdinir (OMNICEF) 300 MG capsule TAKE 1 CAPSULE BY MOUTH TWICE A  DAY FOR 15 DAYS 02/17/17   [provider]  cefUROXime (CEFTIN) 500 MG tablet TAKE 1 TABLET BY MOUTH TWICE A DAY FOR 15 DAYS 03/23/17   [provider]  DULoxetine (CYMBALTA) 60 MG capsule TAKE 1 CAPSULE (60 MG TOTAL) BY MOUTH DAILY. 08/21/17   York Spaniel, MD  gabapentin (NEURONTIN) 600 MG tablet Take 300-1,200 mg by mouth 4 (four) times daily. 300 mg 3 times daily- 1,200 mg at bedtime 07/20/16   [provider]  ibuprofen (ADVIL,MOTRIN)  200 MG tablet Take 200 mg by mouth every 6 (six) hours as needed for pain.    [provider]  metroNIDAZOLE (FLAGYL) 250 MG tablet TAKE 1 TABLET BY MOUTH 3 TIMES A DAY FRI-SUN 06/27/16   [provider]  minocycline (DYNACIN) 100 MG tablet TAKE 1 TABLET BY MOUTH TWICE A DAY FOR 15 DAYS 03/03/17   [provider]  omeprazole (PRILOSEC) 40 MG capsule Take 40 mg by mouth daily.  04/16/17   [provider]  rifampin (RIFADIN) 300 MG capsule Take 300 mg by mouth 3 (three) times daily.  06/27/17   [provider]  SUMAtriptan (IMITREX) 100 MG tablet TAKE 1 TABLET BY MOUTH AS NEEDED FOR MIGRAINE. ONE FOR HEADACHE, NO MORE THAN 3 PER WEEK. 08/27/17   York Spaniel, MD    Allergies    Patient has no known allergies.  Review of Systems   Review of Systems  Constitutional: Negative for fever.  HENT: Negative for rhinorrhea and sore throat.   Eyes: Negative for redness.  Respiratory: Negative for cough and shortness of breath.   Cardiovascular: Positive for chest pain.  Gastrointestinal: Negative for abdominal pain, diarrhea, nausea and vomiting.  Genitourinary: Negative for dysuria, frequency, hematuria and urgency.  Musculoskeletal: Positive for back pain. Negative for myalgias.  Skin: Negative for rash.  Neurological: Negative for headaches.    Physical Exam Updated Vital Signs BP 127/85 (BP Location: Right Arm)   Pulse 77   Temp 98.5 F (36.9 C) (Oral)   Resp 16   Ht 5\' 2"  (1.575 m)   Wt 90.7 kg   SpO2 100%   BMI 36.58 kg/m   Physical Exam Vitals and nursing note reviewed.  Constitutional:      General: She is not in acute distress.    Appearance: She is well-developed.  HENT:     Head: Normocephalic and atraumatic.     Right Ear: External ear normal.     Left Ear: External ear normal.     Nose: Nose normal.  Eyes:     Conjunctiva/sclera: Conjunctivae normal.  Cardiovascular:     Rate and Rhythm: Normal rate and regular rhythm.      Heart sounds: No murmur heard.   Pulmonary:     Effort: No respiratory distress.     Breath sounds: No wheezing, rhonchi or rales.  Abdominal:     Palpations: Abdomen is soft.     Tenderness: There is no abdominal tenderness. There is no guarding or rebound.  Musculoskeletal:     Cervical back: Normal range of motion and neck supple.     Right lower leg: No tenderness. No edema.     Left lower leg: No tenderness. No edema.     Comments: No clinical findings suggestive of DVT.   Skin:    General: Skin is warm and dry.     Findings: No rash.  Neurological:     General: No focal deficit present.  Mental Status: She is alert. Mental status is at baseline.     Motor: No weakness.  Psychiatric:        Mood and Affect: Mood normal.     ED Results / Procedures / Treatments   Labs (all labs ordered are listed, but only abnormal results are displayed) Labs Reviewed  BASIC METABOLIC PANEL - Abnormal; Notable for the following components:      Result Value   BUN 5 (*)    All other components within normal limits  CBC  HEPATIC FUNCTION PANEL  LIPASE, BLOOD  TROPONIN I (HIGH SENSITIVITY)  TROPONIN I (HIGH SENSITIVITY)    ED ECG REPORT   Date: 04/06/2020  Rate: 75  Rhythm: normal sinus rhythm  QRS Axis: normal  Intervals: normal  ST/T Wave abnormalities: nonspecific T wave changes  Conduction Disutrbances:none  Narrative Interpretation:   Old EKG Reviewed: unchanged from 10/2017  I have personally reviewed the EKG tracing and agree with the computerized printout as noted.  Radiology DG Chest 2 View  Result Date: 04/06/2020 CLINICAL DATA:  Chest pain. Additional history provided: Central chest pain for 2 days radiating posteriorly into back. EXAM: CHEST - 2 VIEW COMPARISON:  Prior chest radiographs 08/23/2017 and earlier. FINDINGS: Heart size within normal limits. There is no appreciable airspace consolidation. No evidence of pleural effusion or pneumothorax. No acute bony  abnormality identified. Surgical clips within the right upper quadrant of the abdomen. IMPRESSION: No evidence of active cardiopulmonary disease. Electronically Signed   By: Jackey Loge DO   On: 04/06/2020 11:13    Procedures Procedures (including critical care time)  Medications Ordered in ED Medications - No data to display  ED Course  I have reviewed the triage vital signs and the nursing notes.  Pertinent labs & imaging results that were available during my care of the patient were reviewed by me and considered in my medical decision making (see chart for details).  Patient seen and examined.  Patient looks quite well at time of exam.  Troponin negative x2.  EKG reviewed and was unchanged and nonischemic.  Vital signs are within normal limits without tachycardia or hypoxia.  No reproducible pain on abdominal exam or chest exam.  She reports improving symptoms during the afternoon.  At this point I have low concern for ACS, PE, dissection.  No infectious findings on chest x-ray or by her report.  I would like to obtain lipase and liver function tests given her history of similar pain attributed to her gallbladder.  We will continue to monitor for improvement.  At this point I doubt dangerous or life-threatening etiology of the patient's symptoms.  Vital signs reviewed and are as follows: BP 125/83   Pulse 80   Temp 98.5 F (36.9 C) (Oral)   Resp 14   Ht 5\' 2"  (1.575 m)   Wt 90.7 kg   SpO2 98%   BMI 36.58 kg/m   5:33 PM LFTs/lipase are normal.  Patient updated.  Continues to be pain-free.  She is comfortable with outpatient PCP follow-up.  Discussed bland diet and use of OTC meds if symptoms recur.  Patient was counseled to return with severe chest pain, especially if the pain is crushing or pressure-like and spreads to the arms, back, neck, or jaw, or if they have sweating, nausea, or shortness of breath with the pain. They were encouraged to call 911 with these symptoms.   The  patient was urged to return to the Emergency Department immediately with  worsening of current symptoms, worsening abdominal pain, persistent vomiting, blood noted in stools, fever, or any other concerns. The patient verbalized understanding.      MDM Rules/Calculators/A&P                          CP: Patient with chest pressure today, now improving and resolved.  Doubt ACS.  EKG is unchanged and nonischemic.  Troponin negative x2.  Chest x-ray is clear.  No clinical signs of DVT or PE.  Patient has not tachypneic, hypoxic, or tachycardic.  Doubt dissection given lack of other symptoms or persistent pain.  She looks very well.  Suspect GI etiology.  Liver function test and lipase are normal.  No indication for admission or further work-up at this time.  Patient seems reliable to return with worsening symptoms and follow-up as requested.   Final Clinical Impression(s) / ED Diagnoses Final diagnoses:  Precordial pain    Rx / DC Orders ED Discharge Orders    None       Renne Crigler, PA-C 04/06/20 1735    Gwyneth Sprout, MD 04/07/20 1346

## 2020-04-06 NOTE — ED Triage Notes (Signed)
Patient arrives to ED with complaints of chest pain and nausea that started yesterday 10/11. Pt states the pain is substernal, dull and radiates to the middle of her back. Pt denies SOB. The pain is constant.

## 2020-04-06 NOTE — Discharge Instructions (Signed)
Please read and follow all provided instructions.  Your diagnoses today include:  1. Precordial pain     Tests performed today include:  An EKG of your heart - no changes from previous  A chest x-ray - no sign of infection or other problems  Cardiac enzymes - a blood test for heart muscle damage  Blood counts and electrolytes  Liver and pancreas function tests = were normal  Vital signs. See below for your results today.   Medications prescribed:   None  Take any prescribed medications only as directed.  Follow-up instructions: Please follow-up with your primary care provider if symptoms recur.  Please eat a bland diet and use OTC meds if symptoms recur.  Return instructions:  SEEK IMMEDIATE MEDICAL ATTENTION IF:  You have severe chest pain, especially if the pain is crushing or pressure-like and spreads to the arms, back, neck, or jaw, or if you have sweating, nausea (feeling sick to your stomach), or shortness of breath. THIS IS AN EMERGENCY. Don't wait to see if the pain will go away. Get medical help at once. Call 911 or [phone] (operator). DO NOT drive yourself to the hospital.   Your chest pain gets worse and does not go away with rest.   You have an attack of chest pain lasting longer than usual, despite rest and treatment with the medications your caregiver has prescribed.   You wake from sleep with chest pain or shortness of breath.  You feel dizzy or faint.  You have chest pain not typical of your usual pain for which you originally saw your caregiver.   You have any other emergent concerns regarding your health.  Additional Information: Chest pain comes from many different causes. Your caregiver has diagnosed you as having chest pain that is not specific for one problem, but does not require admission.  You are at low risk for an acute heart condition or other serious illness.   Your vital signs today were: BP 126/84    Pulse 79    Temp 98.5 F (36.9 C)  (Oral)    Resp 13    Ht 5' 2.5" (1.588 m)    Wt 90.7 kg    SpO2 99%    BMI 36.00 kg/m  If your blood pressure (BP) was elevated above 135/85 this visit, please have this repeated by your doctor within one month. --------------

## 2020-07-06 ENCOUNTER — Ambulatory Visit
Admission: RE | Admit: 2020-07-06 | Discharge: 2020-07-06 | Disposition: A | Discharge status: Home / Self Care | Payer: BC Managed Care – PPO | Source: Ambulatory Visit | Attending: Family Medicine | Admitting: Family Medicine

## 2020-07-06 ENCOUNTER — Other Ambulatory Visit: Payer: Self-pay

## 2020-07-06 ENCOUNTER — Other Ambulatory Visit: Payer: Self-pay | Admitting: Family Medicine

## 2020-07-06 DIAGNOSIS — M25511 Pain in right shoulder: Secondary | ICD-10-CM

## 2021-10-13 ENCOUNTER — Other Ambulatory Visit: Payer: Self-pay | Admitting: Specialist

## 2021-10-15 ENCOUNTER — Encounter (HOSPITAL_BASED_OUTPATIENT_CLINIC_OR_DEPARTMENT_OTHER): Payer: Self-pay

## 2021-10-15 DIAGNOSIS — G4710 Hypersomnia, unspecified: Secondary | ICD-10-CM

## 2021-10-15 DIAGNOSIS — R5383 Other fatigue: Secondary | ICD-10-CM

## 2021-10-15 DIAGNOSIS — R0683 Snoring: Secondary | ICD-10-CM

## 2021-11-11 ENCOUNTER — Ambulatory Visit (HOSPITAL_BASED_OUTPATIENT_CLINIC_OR_DEPARTMENT_OTHER): Payer: BC Managed Care – PPO | Attending: Family Medicine | Admitting: Internal Medicine

## 2021-11-11 VITALS — Ht 62.0 in | Wt 200.0 lb

## 2021-11-11 DIAGNOSIS — R5383 Other fatigue: Secondary | ICD-10-CM

## 2021-11-11 DIAGNOSIS — G4710 Hypersomnia, unspecified: Secondary | ICD-10-CM

## 2021-11-11 DIAGNOSIS — G4733 Obstructive sleep apnea (adult) (pediatric): Secondary | ICD-10-CM

## 2021-11-11 DIAGNOSIS — R0683 Snoring: Secondary | ICD-10-CM

## 2021-11-21 DIAGNOSIS — R0683 Snoring: Secondary | ICD-10-CM

## 2021-11-21 NOTE — Procedures (Signed)
Patient Name: Kelly Fields, Kelly Fields Date: 11/11/2021 Gender: Female D.O.B: [birth year] Age (years): 50 Referring Provider: Si Raider NP Height (inches): 62 Interpreting Physician: Baird Lyons MD, ABSM Weight (lbs): 200 RPSGT: Zadie Rhine BMI: 37 MRN: [id number] Neck Size: 15.00  CLINICAL INFORMATION Sleep Study Type: Split Night CPAP Indication for sleep study: Obesity, OSA, Snoring Epworth Sleepiness Score: 17  SLEEP STUDY TECHNIQUE As per the AASM Manual for the Scoring of Sleep and Associated Events v2.3 (April 2016) with a hypopnea requiring 4% desaturations.  The channels recorded and monitored were frontal, central and occipital EEG, electrooculogram (EOG), submentalis EMG (chin), nasal and oral airflow, thoracic and abdominal wall motion, anterior tibialis EMG, snore microphone, electrocardiogram, and pulse oximetry. Continuous positive airway pressure (CPAP) was initiated when the patient met split night criteria and was titrated according to treat sleep-disordered breathing.  MEDICATIONS Medications self-administered by patient taken the night of the study : OXYCODONE HCL, METHOCARBAMOL  RESPIRATORY PARAMETERS Diagnostic  Total AHI (/hr): 15.0 RDI (/hr): 16.9 OA Index (/hr): 1.5 CA Index (/hr): 0.0 REM AHI (/hr): 44.6 NREM AHI (/hr): 11.3 Supine AHI (/hr): N/A Non-supine AHI (/hr): 15 Min O2 Sat (%): 77.00 Mean O2 (%): 93.63 Time below 88% (min): 2.8   Titration  Optimal Pressure (cm): 11 AHI at Optimal Pressure (/hr): 0 Min O2 at Optimal Pressure (%): 93.0 Supine % at Optimal (%): 100 Sleep % at Optimal (%): 100   SLEEP ARCHITECTURE The recording time for the entire night was 357.1 minutes.  During a baseline period of 169.2 minutes, the patient slept for 155.9 minutes in REM and nonREM, yielding a sleep efficiency of 92.2%. Sleep onset after lights out was 7.8 minutes with a REM latency of 88.5 minutes. The patient spent 4.81% of the night in  stage N1 sleep, 76.91% in stage N2 sleep, 7.05% in stage N3 and 11.2% in REM.  During the titration period of 186.1 minutes, the patient slept for 184.3 minutes in REM and nonREM, yielding a sleep efficiency of 99.0%. Sleep onset after CPAP initiation was 0.3 minutes with a REM latency of 22.5 minutes. The patient spent 1.09% of the night in stage N1 sleep, 32.99% in stage N2 sleep, 21.97% in stage N3 and 44% in REM.  CARDIAC DATA The 2 lead EKG demonstrated sinus rhythm. The mean heart rate was 94.17 beats per minute. Other EKG findings include: None.  LEG MOVEMENT DATA The total Periodic Limb Movements of Sleep (PLMS) were 69. The PLMS index was 12.17 .  IMPRESSIONS - Moderate obstructive sleep apnea occurred during the diagnostic portion of the study(AHI = 15.0/hour). An optimal PAP pressure was selected for this patient ( 11 cm of water) - No significant central sleep apnea occurred during the diagnostic portion of the study (CAI = 0.0/hour). - Moderate oxygen desaturation was noted during the diagnostic portion of the study (Min O2 =77.00%). Minimum O2 saturation on CPAP 11 was 93%. - No snoring was audible during the diagnostic portion of the study. - No cardiac abnormalities were noted during this study. - Moderate periodic limb movements of sleep occurred during the study.  DIAGNOSIS - Obstructive Sleep Apnea (G47.33)  RECOMMENDATIONS - Trial of CPAP therapy on 11 cm H2O or autopap 5-15. - Patient used a Small size Resmed Full Face AirFit F20 mask and heated humidification. - Be careful with alcohol, sedatives and other CNS depressants that may worsen sleep apnea and disrupt normal sleep architecture. - Sleep hygiene should be reviewed to assess factors  that may improve sleep quality. - Weight management and regular exercise should be initiated or continued.  [Electronically signed] 11/21/2021 01:42 PM  Baird Lyons MD, [place], American Board of Sleep Medicine NPI:  [id number]                        [place], [facility]  ELECTRONICALLY SIGNED ON:  11/21/2021, 1:38 PM [place] PH: [phone]   FX: [phone] [place]

## 2022-07-24 ENCOUNTER — Other Ambulatory Visit (HOSPITAL_BASED_OUTPATIENT_CLINIC_OR_DEPARTMENT_OTHER): Payer: Self-pay

## 2022-07-24 DIAGNOSIS — G4733 Obstructive sleep apnea (adult) (pediatric): Secondary | ICD-10-CM
